# Patient Record
Sex: Female | Born: 1980 | Race: Black or African American | Hispanic: No | Marital: Single | Smoking: Never smoker
Health system: Southern US, Community
[De-identification: ages and names within clinical notes are randomized; demographics above are authoritative.]

## PROBLEM LIST (undated history)

## (undated) DIAGNOSIS — D649 Anemia, unspecified: Secondary | ICD-10-CM

## (undated) DIAGNOSIS — K429 Umbilical hernia without obstruction or gangrene: Secondary | ICD-10-CM

## (undated) DIAGNOSIS — R0602 Shortness of breath: Secondary | ICD-10-CM

## (undated) DIAGNOSIS — K219 Gastro-esophageal reflux disease without esophagitis: Secondary | ICD-10-CM

## (undated) HISTORY — PX: NO PAST SURGERIES: SHX2092

---
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
## 2001-02-08 ENCOUNTER — Emergency Department (HOSPITAL_COMMUNITY): Admission: EM | Admit: 2001-02-08 | Discharge: 2001-02-09 | Payer: Self-pay

## 2001-02-11 ENCOUNTER — Emergency Department (HOSPITAL_COMMUNITY): Admission: EM | Admit: 2001-02-11 | Discharge: 2001-02-11 | Payer: Self-pay | Admitting: Emergency Medicine

## 2001-07-14 ENCOUNTER — Encounter: Payer: Self-pay | Admitting: General Practice

## 2001-07-14 ENCOUNTER — Encounter: Admission: RE | Admit: 2001-07-14 | Discharge: 2001-07-14 | Payer: Self-pay | Admitting: General Practice

## 2003-06-03 ENCOUNTER — Emergency Department (HOSPITAL_COMMUNITY): Admission: EM | Admit: 2003-06-03 | Discharge: 2003-06-03 | Payer: Self-pay | Admitting: Emergency Medicine

## 2003-07-06 ENCOUNTER — Encounter: Admission: RE | Admit: 2003-07-06 | Discharge: 2003-07-06 | Payer: Self-pay | Admitting: Family Medicine

## 2003-08-03 DIAGNOSIS — L732 Hidradenitis suppurativa: Secondary | ICD-10-CM | POA: Insufficient documentation

## 2003-12-23 ENCOUNTER — Emergency Department (HOSPITAL_COMMUNITY): Admission: EM | Admit: 2003-12-23 | Discharge: 2003-12-23 | Payer: Self-pay | Admitting: Emergency Medicine

## 2005-09-27 ENCOUNTER — Emergency Department (HOSPITAL_COMMUNITY): Admission: EM | Admit: 2005-09-27 | Discharge: 2005-09-27 | Payer: Self-pay | Admitting: Family Medicine

## 2005-11-10 ENCOUNTER — Emergency Department (HOSPITAL_COMMUNITY): Admission: EM | Admit: 2005-11-10 | Discharge: 2005-11-10 | Payer: Self-pay | Admitting: Emergency Medicine

## 2006-03-11 ENCOUNTER — Emergency Department (HOSPITAL_COMMUNITY): Admission: EM | Admit: 2006-03-11 | Discharge: 2006-03-11 | Payer: Self-pay | Admitting: Emergency Medicine

## 2006-03-23 ENCOUNTER — Emergency Department (HOSPITAL_COMMUNITY): Admission: EM | Admit: 2006-03-23 | Discharge: 2006-03-24 | Payer: Self-pay | Admitting: Emergency Medicine

## 2006-04-04 ENCOUNTER — Emergency Department (HOSPITAL_COMMUNITY): Admission: EM | Admit: 2006-04-04 | Discharge: 2006-04-04 | Payer: Self-pay | Admitting: Emergency Medicine

## 2006-04-08 ENCOUNTER — Ambulatory Visit: Payer: Self-pay | Admitting: Internal Medicine

## 2006-04-23 ENCOUNTER — Ambulatory Visit: Payer: Self-pay | Admitting: *Deleted

## 2006-11-07 ENCOUNTER — Emergency Department (HOSPITAL_COMMUNITY): Admission: EM | Admit: 2006-11-07 | Discharge: 2006-11-07 | Payer: Self-pay | Admitting: Emergency Medicine

## 2006-11-25 ENCOUNTER — Ambulatory Visit: Payer: Self-pay | Admitting: Family Medicine

## 2006-12-17 DIAGNOSIS — K219 Gastro-esophageal reflux disease without esophagitis: Secondary | ICD-10-CM

## 2006-12-30 ENCOUNTER — Encounter (INDEPENDENT_AMBULATORY_CARE_PROVIDER_SITE_OTHER): Payer: Self-pay | Admitting: *Deleted

## 2007-01-20 ENCOUNTER — Telehealth (INDEPENDENT_AMBULATORY_CARE_PROVIDER_SITE_OTHER): Payer: Self-pay | Admitting: *Deleted

## 2007-02-02 ENCOUNTER — Encounter (INDEPENDENT_AMBULATORY_CARE_PROVIDER_SITE_OTHER): Payer: Self-pay | Admitting: *Deleted

## 2007-02-04 ENCOUNTER — Telehealth (INDEPENDENT_AMBULATORY_CARE_PROVIDER_SITE_OTHER): Payer: Self-pay | Admitting: *Deleted

## 2007-02-06 ENCOUNTER — Emergency Department (HOSPITAL_COMMUNITY): Admission: EM | Admit: 2007-02-06 | Discharge: 2007-02-06 | Payer: Self-pay | Admitting: Family Medicine

## 2007-02-16 ENCOUNTER — Emergency Department (HOSPITAL_COMMUNITY): Admission: EM | Admit: 2007-02-16 | Discharge: 2007-02-16 | Payer: Self-pay | Admitting: Emergency Medicine

## 2007-02-25 ENCOUNTER — Emergency Department (HOSPITAL_COMMUNITY): Admission: EM | Admit: 2007-02-25 | Discharge: 2007-02-25 | Payer: Self-pay | Admitting: Family Medicine

## 2007-04-06 ENCOUNTER — Encounter (INDEPENDENT_AMBULATORY_CARE_PROVIDER_SITE_OTHER): Payer: Self-pay | Admitting: *Deleted

## 2007-05-05 ENCOUNTER — Ambulatory Visit: Payer: Self-pay | Admitting: Nurse Practitioner

## 2007-05-05 DIAGNOSIS — R197 Diarrhea, unspecified: Secondary | ICD-10-CM

## 2007-05-05 DIAGNOSIS — K469 Unspecified abdominal hernia without obstruction or gangrene: Secondary | ICD-10-CM | POA: Insufficient documentation

## 2007-05-05 DIAGNOSIS — D649 Anemia, unspecified: Secondary | ICD-10-CM

## 2007-05-05 LAB — CONVERTED CEMR LAB
AST: 14 units/L (ref 0–37)
Basophils Relative: 1 % (ref 0–1)
Bilirubin Urine: NEGATIVE
Blood Glucose, Fingerstick: 103
CO2: 25 meq/L (ref 19–32)
Calcium: 9.3 mg/dL (ref 8.4–10.5)
Creatinine, Ser: 0.69 mg/dL (ref 0.40–1.20)
Glucose, Bld: 82 mg/dL (ref 70–99)
Ketones, urine, test strip: NEGATIVE
Lymphocytes Relative: 34 % (ref 12–46)
MCHC: 33.7 g/dL (ref 30.0–36.0)
MCV: 80.7 fL (ref 78.0–100.0)
Monocytes Relative: 12 % (ref 3–12)
Neutro Abs: 2 10*3/uL (ref 1.7–7.7)
Potassium: 4.5 meq/L (ref 3.5–5.3)
Protein, U semiquant: NEGATIVE
RBC: 4.71 M/uL (ref 3.87–5.11)
Specific Gravity, Urine: 1.025
TSH: 1.555 microintl units/mL (ref 0.350–5.50)
WBC: 4.3 10*3/uL (ref 4.0–10.5)

## 2007-05-06 ENCOUNTER — Encounter (INDEPENDENT_AMBULATORY_CARE_PROVIDER_SITE_OTHER): Payer: Self-pay | Admitting: Nurse Practitioner

## 2007-05-12 ENCOUNTER — Encounter (INDEPENDENT_AMBULATORY_CARE_PROVIDER_SITE_OTHER): Payer: Self-pay | Admitting: Family Medicine

## 2007-06-01 ENCOUNTER — Encounter (INDEPENDENT_AMBULATORY_CARE_PROVIDER_SITE_OTHER): Payer: Self-pay | Admitting: Family Medicine

## 2007-06-01 ENCOUNTER — Ambulatory Visit: Payer: Self-pay | Admitting: Family Medicine

## 2007-06-01 LAB — CONVERTED CEMR LAB
Glucose, Urine, Semiquant: NEGATIVE
Urobilinogen, UA: NEGATIVE

## 2007-06-11 ENCOUNTER — Emergency Department (HOSPITAL_COMMUNITY): Admission: EM | Admit: 2007-06-11 | Discharge: 2007-06-11 | Payer: Self-pay | Admitting: Family Medicine

## 2007-09-07 ENCOUNTER — Ambulatory Visit: Payer: Self-pay | Admitting: Family Medicine

## 2007-09-07 DIAGNOSIS — M940 Chondrocostal junction syndrome [Tietze]: Secondary | ICD-10-CM | POA: Insufficient documentation

## 2007-12-13 ENCOUNTER — Emergency Department (HOSPITAL_COMMUNITY): Admission: EM | Admit: 2007-12-13 | Discharge: 2007-12-13 | Payer: Self-pay | Admitting: Emergency Medicine

## 2008-02-08 ENCOUNTER — Emergency Department (HOSPITAL_COMMUNITY): Admission: EM | Admit: 2008-02-08 | Discharge: 2008-02-08 | Payer: Self-pay | Admitting: Emergency Medicine

## 2008-02-15 ENCOUNTER — Emergency Department (HOSPITAL_COMMUNITY): Admission: EM | Admit: 2008-02-15 | Discharge: 2008-02-15 | Payer: Self-pay | Admitting: Family Medicine

## 2008-08-08 ENCOUNTER — Emergency Department (HOSPITAL_COMMUNITY): Admission: EM | Admit: 2008-08-08 | Discharge: 2008-08-08 | Payer: Self-pay | Admitting: Emergency Medicine

## 2008-08-18 ENCOUNTER — Telehealth (INDEPENDENT_AMBULATORY_CARE_PROVIDER_SITE_OTHER): Payer: Self-pay | Admitting: Nurse Practitioner

## 2008-09-15 ENCOUNTER — Emergency Department (HOSPITAL_COMMUNITY): Admission: EM | Admit: 2008-09-15 | Discharge: 2008-09-15 | Payer: Self-pay | Admitting: *Deleted

## 2008-09-22 ENCOUNTER — Telehealth (INDEPENDENT_AMBULATORY_CARE_PROVIDER_SITE_OTHER): Payer: Self-pay | Admitting: *Deleted

## 2008-09-28 ENCOUNTER — Emergency Department (HOSPITAL_COMMUNITY): Admission: EM | Admit: 2008-09-28 | Discharge: 2008-09-28 | Payer: Self-pay | Admitting: Family Medicine

## 2008-10-06 ENCOUNTER — Ambulatory Visit: Payer: Self-pay | Admitting: Nurse Practitioner

## 2008-10-06 DIAGNOSIS — B977 Papillomavirus as the cause of diseases classified elsewhere: Secondary | ICD-10-CM | POA: Insufficient documentation

## 2008-10-06 DIAGNOSIS — N644 Mastodynia: Secondary | ICD-10-CM | POA: Insufficient documentation

## 2008-10-06 DIAGNOSIS — R5383 Other fatigue: Secondary | ICD-10-CM

## 2008-10-06 DIAGNOSIS — R5381 Other malaise: Secondary | ICD-10-CM

## 2008-10-13 LAB — CONVERTED CEMR LAB
ALT: <8 units/L (ref 0–35)
Alkaline Phosphatase: 55 units/L (ref 39–117)
BUN: 6 mg/dL (ref 6–23)
Basophils Absolute: 0 10*3/uL (ref 0.0–0.1)
CO2: 25 meq/L (ref 19–32)
Calcium: 9.8 mg/dL (ref 8.4–10.5)
Chloride: 108 meq/L (ref 96–112)
Creatinine, Ser: 0.77 mg/dL (ref 0.40–1.20)
HCT: 32.7 % — ABNORMAL LOW (ref 36.0–46.0)
Lymphs Abs: 0.9 10*3/uL (ref 0.7–4.0)
MCHC: 32.4 g/dL (ref 30.0–36.0)
MCV: 71.1 fL — ABNORMAL LOW (ref 78.0–100.0)
Monocytes Relative: 27 % — ABNORMAL HIGH (ref 3–12)
Neutro Abs: 1.1 10*3/uL — ABNORMAL LOW (ref 1.7–7.7)
Neutrophils Relative %: 37 % — ABNORMAL LOW (ref 43–77)
Platelets: 282 10*3/uL (ref 150–400)
RBC: 4.6 M/uL (ref 3.87–5.11)
TSH: 0.502 microintl units/mL (ref 0.350–4.500)

## 2009-02-19 ENCOUNTER — Ambulatory Visit: Payer: Self-pay | Admitting: Nurse Practitioner

## 2009-02-19 DIAGNOSIS — R109 Unspecified abdominal pain: Secondary | ICD-10-CM | POA: Insufficient documentation

## 2009-02-23 ENCOUNTER — Telehealth (INDEPENDENT_AMBULATORY_CARE_PROVIDER_SITE_OTHER): Payer: Self-pay | Admitting: Nurse Practitioner

## 2009-02-27 ENCOUNTER — Encounter (INDEPENDENT_AMBULATORY_CARE_PROVIDER_SITE_OTHER): Payer: Self-pay | Admitting: Nurse Practitioner

## 2009-03-06 ENCOUNTER — Telehealth (INDEPENDENT_AMBULATORY_CARE_PROVIDER_SITE_OTHER): Payer: Self-pay | Admitting: Nurse Practitioner

## 2009-03-13 ENCOUNTER — Ambulatory Visit (HOSPITAL_COMMUNITY): Admission: RE | Admit: 2009-03-13 | Discharge: 2009-03-13 | Payer: Self-pay | Admitting: Internal Medicine

## 2009-03-13 ENCOUNTER — Encounter (INDEPENDENT_AMBULATORY_CARE_PROVIDER_SITE_OTHER): Payer: Self-pay | Admitting: Nurse Practitioner

## 2009-03-15 ENCOUNTER — Telehealth (INDEPENDENT_AMBULATORY_CARE_PROVIDER_SITE_OTHER): Payer: Self-pay | Admitting: Nurse Practitioner

## 2009-03-27 ENCOUNTER — Telehealth (INDEPENDENT_AMBULATORY_CARE_PROVIDER_SITE_OTHER): Payer: Self-pay | Admitting: Nurse Practitioner

## 2009-05-08 ENCOUNTER — Telehealth (INDEPENDENT_AMBULATORY_CARE_PROVIDER_SITE_OTHER): Payer: Self-pay | Admitting: Nurse Practitioner

## 2009-05-15 ENCOUNTER — Encounter (INDEPENDENT_AMBULATORY_CARE_PROVIDER_SITE_OTHER): Payer: Self-pay | Admitting: *Deleted

## 2009-05-17 ENCOUNTER — Ambulatory Visit: Payer: Self-pay | Admitting: Obstetrics and Gynecology

## 2009-05-17 ENCOUNTER — Other Ambulatory Visit: Admission: RE | Admit: 2009-05-17 | Discharge: 2009-05-17 | Payer: Self-pay | Admitting: Obstetrics and Gynecology

## 2009-05-18 ENCOUNTER — Telehealth (INDEPENDENT_AMBULATORY_CARE_PROVIDER_SITE_OTHER): Payer: Self-pay | Admitting: Nurse Practitioner

## 2009-05-23 ENCOUNTER — Encounter (INDEPENDENT_AMBULATORY_CARE_PROVIDER_SITE_OTHER): Payer: Self-pay | Admitting: *Deleted

## 2009-06-07 ENCOUNTER — Ambulatory Visit: Payer: Self-pay | Admitting: Obstetrics and Gynecology

## 2009-07-23 ENCOUNTER — Encounter (INDEPENDENT_AMBULATORY_CARE_PROVIDER_SITE_OTHER): Payer: Self-pay | Admitting: Nurse Practitioner

## 2009-07-27 ENCOUNTER — Encounter (INDEPENDENT_AMBULATORY_CARE_PROVIDER_SITE_OTHER): Payer: Self-pay | Admitting: Nurse Practitioner

## 2009-08-01 ENCOUNTER — Telehealth (INDEPENDENT_AMBULATORY_CARE_PROVIDER_SITE_OTHER): Payer: Self-pay | Admitting: Nurse Practitioner

## 2009-09-19 ENCOUNTER — Telehealth (INDEPENDENT_AMBULATORY_CARE_PROVIDER_SITE_OTHER): Payer: Self-pay | Admitting: Nurse Practitioner

## 2009-11-06 ENCOUNTER — Ambulatory Visit: Payer: Self-pay | Admitting: Nurse Practitioner

## 2009-11-06 DIAGNOSIS — R51 Headache: Secondary | ICD-10-CM

## 2009-11-06 DIAGNOSIS — K089 Disorder of teeth and supporting structures, unspecified: Secondary | ICD-10-CM | POA: Insufficient documentation

## 2009-11-06 DIAGNOSIS — R519 Headache, unspecified: Secondary | ICD-10-CM | POA: Insufficient documentation

## 2009-11-07 LAB — CONVERTED CEMR LAB
Basophils Absolute: 0 10*3/uL (ref 0.0–0.1)
Basophils Relative: 1 % (ref 0–1)
Eosinophils Absolute: 0.3 10*3/uL (ref 0.0–0.7)
HCT: 36.8 % (ref 36.0–46.0)
Hemoglobin: 12.1 g/dL (ref 12.0–15.0)
Lymphocytes Relative: 38 % (ref 12–46)
Lymphs Abs: 1.4 10*3/uL (ref 0.7–4.0)
Neutro Abs: 1.6 10*3/uL — ABNORMAL LOW (ref 1.7–7.7)
Platelets: 313 10*3/uL (ref 150–400)
RDW: 14.2 % (ref 11.5–15.5)

## 2009-11-20 ENCOUNTER — Telehealth (INDEPENDENT_AMBULATORY_CARE_PROVIDER_SITE_OTHER): Payer: Self-pay | Admitting: Nurse Practitioner

## 2009-12-12 ENCOUNTER — Telehealth (INDEPENDENT_AMBULATORY_CARE_PROVIDER_SITE_OTHER): Payer: Self-pay | Admitting: Nurse Practitioner

## 2009-12-14 ENCOUNTER — Telehealth (INDEPENDENT_AMBULATORY_CARE_PROVIDER_SITE_OTHER): Payer: Self-pay | Admitting: Nurse Practitioner

## 2009-12-14 DIAGNOSIS — R079 Chest pain, unspecified: Secondary | ICD-10-CM

## 2009-12-15 ENCOUNTER — Emergency Department (HOSPITAL_COMMUNITY): Admission: EM | Admit: 2009-12-15 | Discharge: 2009-12-16 | Payer: Self-pay | Admitting: Emergency Medicine

## 2010-01-16 ENCOUNTER — Encounter (INDEPENDENT_AMBULATORY_CARE_PROVIDER_SITE_OTHER): Payer: Self-pay | Admitting: Nurse Practitioner

## 2010-02-28 ENCOUNTER — Ambulatory Visit: Payer: Self-pay | Admitting: Nurse Practitioner

## 2010-03-01 LAB — CONVERTED CEMR LAB
ALT: 8 units/L (ref 0–35)
Albumin: 4.5 g/dL (ref 3.5–5.2)
Alkaline Phosphatase: 49 units/L (ref 39–117)
Calcium: 9.1 mg/dL (ref 8.4–10.5)
Chloride: 105 meq/L (ref 96–112)
Eosinophils Relative: 7 % — ABNORMAL HIGH (ref 0–5)
Glucose, Bld: 82 mg/dL (ref 70–99)
HCT: 34.6 % — ABNORMAL LOW (ref 36.0–46.0)
HDL: 45 mg/dL (ref >39)
Hemoglobin: 11.2 g/dL — ABNORMAL LOW (ref 12.0–15.0)
LDL Cholesterol: 71 mg/dL (ref 0–99)
MCV: 78.6 fL (ref 78.0–100.0)
Monocytes Absolute: 0.4 10*3/uL (ref 0.1–1.0)
Monocytes Relative: 11 % (ref 3–12)
Neutrophils Relative %: 47 % (ref 43–77)
Platelets: 270 10*3/uL (ref 150–400)
RBC: 4.4 M/uL (ref 3.87–5.11)
RDW: 14.5 % (ref 11.5–15.5)
Total CHOL/HDL Ratio: 2.7
Total Protein: 7.3 g/dL (ref 6.0–8.3)
Triglycerides: 37 mg/dL (ref <150)
VLDL: 7 mg/dL (ref 0–40)
WBC: 3.7 10*3/uL — ABNORMAL LOW (ref 4.0–10.5)

## 2010-03-04 ENCOUNTER — Encounter (INDEPENDENT_AMBULATORY_CARE_PROVIDER_SITE_OTHER): Payer: Self-pay | Admitting: Nurse Practitioner

## 2010-03-04 ENCOUNTER — Ambulatory Visit (HOSPITAL_COMMUNITY): Admission: RE | Admit: 2010-03-04 | Discharge: 2010-03-04 | Payer: Self-pay | Admitting: Internal Medicine

## 2010-03-19 ENCOUNTER — Emergency Department (HOSPITAL_COMMUNITY)
Admission: EM | Admit: 2010-03-19 | Discharge: 2010-03-19 | Payer: Self-pay | Source: Home / Self Care | Admitting: Emergency Medicine

## 2010-05-05 ENCOUNTER — Encounter: Payer: Self-pay | Admitting: Internal Medicine

## 2010-05-14 NOTE — Progress Notes (Signed)
Summary: Derm update - No show  Phone Note From Other Clinic   Caller: Referral Coordinator Summary of Call: Just wanted to let you know that Pt noshowed for Derm appt on 09-18-09 Initial call taken by: Candi Leash,  September 19, 2009 9:33 AM  Follow-up for Phone Call        Good to know thanks Follow-up by: Lehman Prom FNP,  September 19, 2009 9:50 AM

## 2010-05-14 NOTE — Progress Notes (Signed)
Summary: CHEST PAIN   Phone Note Call from Patient   Summary of Call: pT IS UNDER A LOT OF CHEST PAIN AND LAST THURSDAY PT SPIT BLOOD AND SHE DOES NOT KNOW WHAT TO DO .  PT WANTS TO BE SEEN TODAY IF THAT IS POSSIBLE. MARTIN FNP Initial call taken by: Manon Hilding,  November 20, 2009 9:31 AM  Follow-up for Phone Call        c/o CP surrounding heart, worse yesterday, been going on for awhile.  States she thought that the blood was from her tooth.  States pain is non-radiating, denies SOB or diaphoresis. Will call back.   Follow-up by: Dutch Quint RN,  November 20, 2009 9:45 AM  Additional Follow-up for Phone Call Additional follow up Details #1::        Left message with female for pt. to return call.  Dutch Quint RN  November 20, 2009 12:10 PM  Left message with female for pt. to return call.  Dutch Quint RN  November 20, 2009 4:30 PM  Female answered phone "she not here" and hung up.  Dutch Quint RN  November 21, 2009 9:54 AM     Additional Follow-up for Phone Call Additional follow up Details #2::    spoke with pt she says that she has been having chest pain and spitting up blood on last week but not alot of blood.  she says that she has come in the office a couple of times for this and has an EKG done and nothing was found, but she is still having chest pain she says that sometimes she has soreness and it feels like a shock in her chest.  Levon Hedger  November 22, 2009 9:42 AM  Yes, pt has been seen several times for this and it was determined that pain is not cardiac related. It may be related to either muscle - from lifing or pulling objects in that case she can press chest and it should make pain worse or it also may be from GERD (acid reflux).  This may be the case if she is coughing up blood, which may be from stomach irritation.  Related to muscle pain - she can take tylenol Extra strength (would not advise ibuprofen at this time as this may make stomach problems worsen.  She can  take over the counter prilosec OTC twice daily  n.martin,fnp  November 22, 2009  9:53 AM    Additional Follow-up for Phone Call Additional follow up Details #3:: Details for Additional Follow-up Action Taken: pt aware of above information. Levon Hedger  November 22, 2009 10:28 AM

## 2010-05-14 NOTE — Letter (Signed)
Summary: [facility]  [facility]   Imported By: Arta Bruce 08/01/2009 11:48:15  _____________________________________________________________________  External Attachment:    Type:   Image     Comment:   External Document

## 2010-05-14 NOTE — Progress Notes (Signed)
Summary: Dermatology referral   Phone Note Outgoing Call   Summary of Call: Dermatology referral ordered under the advisement of GYN to whom she was previously referred.. 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   Initial call taken by: Lehman Prom FNP,  August 01, 2009 8:58 AM  Follow-up for Phone Call        referral printed at [facility]. Follow-up by: Mikey College CMA,  August 01, 2009 10:41 AM

## 2010-05-14 NOTE — Letter (Signed)
Summary: *HSN Results Follow up  [facility]  [address] [place], [place] [address]   Phone: [phone]  Fax: [phone]      05/23/2009   [facility] [address] [place], [place]  [address]   Dear  Ms. [facility],                            ____S.Drinkard,FNP   ____D. Gore,FNP       ____B. McPherson,MD   ____V. Rankins,MD    ____E. Mulberry,MD    ____N. Daphine Deutscher, FNP  ____D. Reche Dixon, MD    ____K. Philipp Deputy, MD    ____Other     This letter is to inform you that your recent test(s):  _______Pap Smear    _______Lab Test     _______X-ray    _______ is within acceptable limits  _______ requires a medication change  _______ requires a follow-up lab visit  _______ requires a follow-up visit with your provider   Comments:  We have been trying to reach you.  Please give the office a call at your earliest convenience.       _________________________________________________________ If you have any questions, please contact our office                     Sincerely,  Arm[place] Shannon [facility]

## 2010-05-14 NOTE — Progress Notes (Signed)
   Phone Note Call from Patient Call back at [facility] Phone [phone]   Summary of Call: The pt is having pain in her right breast and she is going to have an appointment in Feb at the [facility] and she is wondering if she can have a mammogram on the same day. The pt was confused with the date of the appointment because in the paper shows feb 2nd but in the computer shows feb 3rd [facility]  Initial call taken by: Manon Hilding,  May 08, 2009 4:22 PM  Follow-up for Phone Call        called [phone] person that answered phone said that pt was not there and as I was trying to leave a message for pt they hung up the phone on me.  Pt's appt is on 05/17/2009 @ 2:15 at [facility] Levon Hedger  May 14, 2009 10:36 AM   called [phone] person that answered phone said that pt was not there and as I was trying to leave a message for pt they hung up the phone on me.  Pt's appt is on 05/17/2009 @ 2:15 at [facility]. Will mail letter. Levon Hedger  May 15, 2009 4:35 PM  Follow-up by: Levon Hedger,  May 15, 2009 4:37 PM

## 2010-05-14 NOTE — Assessment & Plan Note (Signed)
Summary: F/u Chronic Issues   Vital Signs:  Patient profile:   30 year old female Menstrual status:  regular LMP:     11/03/2009 Weight:      144.7 pounds Temp:     98.1 degrees F oral Pulse rate:   72 / minute Pulse rhythm:   regular Resp:     16 per minute BP sitting:   108 / 73  (left arm) Cuff size:   regular  Vitals Entered By: Levon Hedger (November 06, 2009 11:39 AM) CC: follow-up visit up on private issues...has been having chest pain and in back that happens off and on, Headache Is Patient Diabetic? No Pain Assessment Patient in pain? no       Does patient need assistance? Functional Status Self care Ambulation Normal LMP (date): 11/03/2009 LMP - Character: heavy     Enter LMP: 11/03/2009 Last PAP Result done at [facility]   CC:  follow-up visit up on private issues...has been having chest pain and in back that happens off and on and Headache.  History of Present Illness:  Pt into the office for f/u on several different issues.  Vaginal issues - pt has been to GYN and had a biopsy done.  She was referred to Dermatology but pt was not able to keep the appt because her orange card expired  Vaginal warts - Pt used topical agent in the past which did help with warts.  however she has noticed that warts have returned at various other parts of her vaginal area.  Hx of anemia - Pt has not continued to take her iron pills.  She reports that the last time she was seen her and had labs she was advised that she did have to take the medications.  Headache - C/O of headache Pt does wear glasses but she only wears them at night for driving. Pt denies any anxiety No recent dental  exam   Headache HPI:      The location of the headaches are bilateral.  Headache quality is sharp (knife-like).        Positive alarm features include scalp tenderness.  The patient denies seizures.         Medications Prior to Update: 1)  Ferrous Sulfate 325 (65 Fe) Mg  Tabs  (Ferrous Sulfate) .Marland Kitchen.. 1 Tablet By Mouth Daily For Iron  Allergies (verified): No Known Drug Allergies  Review of Systems CV:  Complains of chest pain or discomfort; mostly after movement - she noticed last time when playin with her nephew. Then she has trouble breathing.Marland Kitchen Resp:  Denies cough and shortness of breath. GI:  Denies abdominal pain, nausea, and vomiting. Neuro:  Complains of headaches.  Physical Exam  General:  alert.   Head:  normocephalic.   Lungs:  normal breath sounds.   Heart:  normal rate and regular rhythm.   Abdomen:  normal bowel sounds.   Neurologic:  alert & oriented X3.   Skin:  color normal.   Psych:  Oriented X3.     Impression & Recommendations:  Problem # 1:  HEADACHE (ICD-784.0)  advised pt not to take excedrin   Her updated medication list for this problem includes:    Butalbital-apap 50-325 Mg Tabs (Butalbital-acetaminophen) ..... 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 1 tablet by mouth daily for iron  Orders: T-CBC w/Diff ([id number])  Problem # 5:  COSTOCHONDRITIS (ICD-733.6) pt admits that she does lots of lifting at her new job - reviewed with dx with pt  Complete Medication List: 1)  Ferrous Sulfate 325 (65 Fe) Mg Tabs (Ferrous sulfate) .Marland Kitchen.. 1 tablet by mouth daily for iron 2)  Podofilox 0.5 % Soln (Podofilox) .... Apply two times a day for 3 consecutive days of a week and repeat up to 4 weeks 3)  Butalbital-apap 50-325 Mg Tabs (Butalbital-acetaminophen) .... One tablet by mouth by mouth two times a day as needed for headache  Patient Instructions: 1)  Dermatology referra Arna Medici, reschedule  dermatology referral (see previous order in EMR) 2)  Chest pain - most likely from overuse of chest muscles 3)  Dentist - you will be put on the waiting list for the dental clinic.  They will notify you with the time/date of the appointment 4)  Headache - most likely tension or muscle 5)  No head CT needed 6)  DO NOT TAKE EXCEDRIN FOR HEADACHES AS THIS WILL CAUSE REBOUND HEADACHES 7)  Avoid soda, coffee, and tea 8)  Start sedapap as needed for headache 9)  if not effective will consider a medication to decrease stress or tension in your entire body.  It may be manifesting itself with your headaches 10)  Anemia - will recheck labs today Prescriptions: BUTALBITAL-APAP 50-325 MG TABS (BUTALBITAL-ACETAMINOPHEN) One tablet by mouth by mouth two times a day as needed for headache  #30 x 0   Entered and Authorized by:   Lehman Prom FNP   Signed by:   Lehman Prom FNP on 11/06/2009   Method used:   Print then Give to Patient   RxID:   [id number] PODOFILOX 0.5 % SOLN (PODOFILOX) Apply two times a day for 3 consecutive days of a week and repeat up to 4 weeks  #3.52ml x 0   Entered and Authorized by:   Lehman Prom FNP   Signed by:   Lehman Prom FNP on 11/06/2009   Method used:   Print then Give to Patient   RxID:   [id number]

## 2010-05-14 NOTE — Letter (Signed)
Summary: Handout Printed  Printed Handout:  - Depression-Brief 

## 2010-05-14 NOTE — Progress Notes (Signed)
Summary: Cardiology referral   Phone Note Outgoing Call   Summary of Call: refer pt to cardiology continous c/o chest pain  dx with costochondritis but pt is quite persistent about wanting further workup Initial call taken by: Lehman Prom FNP,  December 14, 2009 12:57 PM  Follow-up for Phone Call        PT HAVE AN APPT [facility] 01-16-10 @ 10:30AM DR Loraine Leriche SKAINS  LVM TO PT 2 RETURN MY CALL  Follow-up by: Cheryll Dessert,  December 24, 2009 9:06 AM  New Problems: CHEST PAIN (ICD-786.50)   New Problems: CHEST PAIN (ICD-786.50) Phone Note Outgoing Call   Summary of Call: refer pt to cardiology continous c/o chest pain  dx with costochondritis but pt is quite persistent about wanting further workup Initial call taken by: Lehman Prom FNP,  December 14, 2009 12:57 PM  Follow-up for Phone Call        PT HAVE AN APPT [facility] 01-16-10 @ 10:30AM DR Loraine Leriche SKAINS  LVM TO PT 2 RETURN MY CALL  Follow-up by: Cheryll Dessert,  December 24, 2009 9:06 AM  New Problems: CHEST PAIN (ICD-786.50)   New Problems: CHEST PAIN (ICD-786.50)

## 2010-05-14 NOTE — Letter (Signed)
Summary: DENTAL REFERRAL  DENTAL REFERRAL   Imported By: Arta Bruce 11/07/2009 10:52:22  _____________________________________________________________________  External Attachment:    Type:   Image     Comment:   External Document

## 2010-05-14 NOTE — Letter (Signed)
Summary: *HSN Results Follow up  [facility]  [address] [place], [place] [address]   Phone: [phone]  Fax: [phone]      05/15/2009   [facility] [address] [place], [place]  [address]   Dear  Ms. [facility],                            ____S.Drinkard,FNP   ____D. Gore,FNP       ____B. McPherson,MD   ____V. Rankins,MD    ____E. Mulberry,MD    ____N. Daphine Deutscher, FNP  ____D. Reche Dixon, MD    ____K. Philipp Deputy, MD    ____Other     This letter is to inform you that your recent test(s):  _______Pap Smear    _______Lab Test     _______X-ray    _______ is within acceptable limits  _______ requires a medication change  _______ requires a follow-up lab visit  _______ requires a follow-up visit with your provider   Comments:  We have tried reaching you at [phone].  Your appointment is scheduled for May 17, 2009 @ 2:15PM.  Please contact the office if you have any questions       _________________________________________________________ If you have any questions, please contact our office                     Sincerely,  Levon Hedger [facility]

## 2010-05-14 NOTE — Progress Notes (Signed)
Summary: Referral   Phone Note Call from Patient   Summary of Call: Pt requesting referral for her continued head aches and chest pains.  She also wants referral to GYN.  She does not want to keep coming here for the same issues. Pt can be reached at [phone] Initial call taken by: Vesta Mixer CMA,  December 12, 2009 10:11 AM  Follow-up for Phone Call        Levon Hedger  December 12, 2009 2:28 PM Left message on machine for pt to return call to the office.  Levon Hedger  December 14, 2009 10:21 AM spoke with pt she says that she is still having pain in her chest and wants to get checked out but would like to be referred to find out what is going on because she is still having sharp pain in her chest and SOB when she is not doing anything and she is very concerned.  she is also requesting an GYN referral she says that Ms. Daphine Deutscher knows why.  Additional Follow-up for Phone Call Additional follow up Details #1::        Pt has been to GYN - there is nothing else they can do.  She was referred by them to dermatology and pt missed the appt. Regarding her chest pain - this is due to overuse of muscle.  Can refer her to cardiology if it will make her feel more at eased but she should not be surprised if the workup is negative as well.  She need to be mindful of what is he lifting Additional Follow-up by: Lehman Prom FNP,  December 14, 2009 10:48 AM    Additional Follow-up for Phone Call Additional follow up Details #2::    pt informed and would like to be referred to Cardiology. Levon Hedger  December 14, 2009 12:53 PM

## 2010-05-14 NOTE — Progress Notes (Signed)
Summary: Breast tenderness   Phone Note Call from Patient   Summary of Call: pt called and would like a mammogram appt. in the evening...Marland KitchenMarland Kitchen pt says she is having continuing pain both breast and she would lke to know what is going on Initial call taken by: [place] Shannon,  May 18, 2009 4:45 PM  Follow-up for Phone Call        Pain in breast does not necessarily warrant a mammogram. Mammogram in 30 year old will be very indeterminate due to dense breast tissue.  Does she feel any lumps that are out of the ordinary?  round areas that move under your fingers are usually breast ducts and are normal she can come discuss this further but problem is likely hormonal since it involves both breasts.  She should avoid soda, tea, coffee, cigarettes and chocolate as these increase the tenderness. Ibuprofen as needed for pain worse about 3-5 days before cycle Follow-up by: Lehman Prom FNP,  May 18, 2009 5:20 PM  Additional Follow-up for Phone Call Additional follow up Details #1::        Left message with lady for pt to call back.Marland KitchenMarland KitchenMarland KitchenArmenia Shannon  May 21, 2009 12:59 PM  Left message with lady for pt to call back.Marland KitchenMarland KitchenArmenia Shannon  May 22, 2009 11:35 AM  Left message with lady for pt to call back..wil mail letter.Marland KitchenMarland KitchenArmenia Shannon  May 22, 2009 4:41 PM   Left message with lady for pt to call back.Marland KitchenMarland KitchenArmenia Shannon  May 23, 2009 11:17 AM

## 2010-05-14 NOTE — Assessment & Plan Note (Signed)
Summary: Headache/Chest pain   Vital Signs:  Patient profile:   30 year old female Menstrual status:  regular Height:      64.5 inches Weight:      147.8 pounds BMI:     25.07 Temp:     97.9 degrees F oral Pulse rate:   76 / minute Pulse rhythm:   regular Resp:     18 per minute BP sitting:   90 / 52  (left arm) Cuff size:   regular  Vitals Entered By: [place] Shannon (February 28, 2010 1:02 PM)  Nutrition Counseling: Patient's BMI is greater than 25 and therefore counseled on weight management options. CC: pt says she has infection in her tooth... pt wants a MRI because of her headaches.... pt says she is getting numbness in fingers and right knee...  Does patient need assistance? Functional Status Self care Ambulation Normal   CC:  pt says she has infection in her tooth... pt wants a MRI because of her headaches.... pt says she is getting numbness in fingers and right knee....  History of Present Illness:  Pt into the office for f/u on dental pain. Pt was called from the dental clinic and was told that she has an appt in 2 weeks. Pain mostly in left back tooth - ? wisdom tooth.  She has never had these extracted. At time has bleeding in the same areaa  Headaches - intermittent family hx of migraines  She was not able to afford the sedapap as ordered on last visit headache is usually on the left side  Chest pain - pt has been to the cardiologist and was advised that chest pain is not cardiac related. Still has intermittent chest pain  Current Medications (verified): 1)  Ferrous Sulfate 325 (65 Fe) Mg  Tabs (Ferrous Sulfate) .Marland Kitchen.. 1 Tablet By Mouth Daily For Iron 2)  Podofilox 0.5 % Soln (Podofilox) .... Apply Two Times A Day For 3 Consecutive Days of A Week and Repeat Up To 4 Weeks 3)  Butalbital-Apap 50-325 Mg Tabs (Butalbital-Acetaminophen) .... One Tablet By Mouth By Mouth Two Times A Day As Needed For Headache  Allergies (verified): No Known Drug  Allergies  Review of Systems General:  dental pain. CV:  Complains of chest pain or discomfort. Resp:  Denies cough. GI:  Denies nausea and vomiting. Neuro:  Complains of headaches.  Physical Exam  General:  alert.   Head:  normocephalic.   Mouth:  fair dentition.  impacted wisdom teeth Chest Wall:  no tenderness with palpation Lungs:  normal breath sounds.   Heart:  normal rate and regular rhythm.   Msk:  normal ROM.   Neurologic:  alert & oriented X3.   Skin:  color normal.   Psych:  Oriented X3.     Impression & Recommendations:  Problem # 1:  CHEST PAIN (ICD-786.50) cardiac workup is negative advised pt that symptoms are likely anxiety/panic handout given  Problem # 2:  DENTAL PAIN (ICD-525.9) will refer to dental clinic will cover with amoxil  Problem # 3:  HEADACHE (ICD-784.0) will order head ct as per pt request advised her to limit caffiene, nicotine and stress The following medications were removed from the medication list:    Butalbital-apap 50-325 Mg Tabs (Butalbital-acetaminophen) ..... One tablet by mouth by mouth two times a day as needed for headache  Orders: T-Comprehensive Metabolic Panel ([address]109-60454) T-CBC w/Diff ([id number]) T-Lipid Profile (29562-13086) Rapid HIV  (57846) CT without Contrast (CT w/o contrast)  Complete Medication  List: 1)  Ferrous Sulfate 325 (65 Fe) Mg Tabs (Ferrous sulfate) .Marland Kitchen.. 1 tablet by mouth daily for iron 2)  Amoxicillin 500 Mg Tabs (Amoxicillin) .... One tablet by mouth three times a day for infection  Patient Instructions: 1)  Headache - you will be called with the time/date of the head CT appointment 2)  You have declined the flu vaccine today. 3)  Stress - this may be causing some of your symptoms.  Read the handout and see how many of the symptoms apply to you.  If more than not then you may be having problems with mood/anxiety that you are not aware of 4)  Dental problems - you should take the medications for  infections 5)  and wait for the dental appointment Prescriptions: AMOXICILLIN 500 MG TABS (AMOXICILLIN) One tablet by mouth three times a day for infection  #30 x 0   Entered and Authorized by:   Lehman Prom FNP   Signed by:   Lehman Prom FNP on 02/28/2010   Method used:   Print then Give to Patient   RxID:   [id number]    Orders Added: 1)  Est. Patient Level III [[address]782] 2)  T-Comprehensive Metabolic Panel [80053-22900] 3)  T-CBC w/Diff [95621-30865] 4)  T-Lipid Profile [80061-22930] 5)  Rapid HIV  [92370] 6)  CT without Contrast [CT w/o contrast]   Not Administered:    Influenza Vaccine not given due to: declined     Laboratory Results  Date/Time Received: February 28, 2010 2:40 PM  Date/Time Reported: February 28, 2010 2:40 PM   Other Tests  Rapid HIV: negative

## 2010-05-14 NOTE — Letter (Signed)
Summary: Handout Printed  Printed Handout:  - Headache, Tension (Muscle Contraction Headache) 

## 2010-05-14 NOTE — Consult Note (Signed)
Summary: Consultation Report.Marland Kitchen//[facility]  Consultation Report.Marland Kitchen//[facility]   Imported By: Arta Bruce 01/21/2010 11:48:14  _____________________________________________________________________  External Attachment:    Type:   Image     Comment:   External Document

## 2010-05-14 NOTE — Miscellaneous (Signed)
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

## 2010-06-10 ENCOUNTER — Inpatient Hospital Stay (INDEPENDENT_AMBULATORY_CARE_PROVIDER_SITE_OTHER)
Admission: RE | Admit: 2010-06-10 | Discharge: 2010-06-10 | Disposition: A | Discharge status: Home / Self Care | Payer: Self-pay | Source: Ambulatory Visit | Attending: Family Medicine | Admitting: Family Medicine

## 2010-06-10 ENCOUNTER — Ambulatory Visit (INDEPENDENT_AMBULATORY_CARE_PROVIDER_SITE_OTHER): Payer: Self-pay

## 2010-06-10 DIAGNOSIS — J45909 Unspecified asthma, uncomplicated: Secondary | ICD-10-CM

## 2010-06-10 DIAGNOSIS — K5900 Constipation, unspecified: Secondary | ICD-10-CM

## 2010-06-10 LAB — POCT URINALYSIS DIPSTICK
Ketones, ur: NEGATIVE mg/dL
Nitrite: NEGATIVE
Protein, ur: 100 mg/dL — AB

## 2010-06-10 LAB — POCT PREGNANCY, URINE: Preg Test, Ur: NEGATIVE

## 2010-07-22 LAB — URINALYSIS, ROUTINE W REFLEX MICROSCOPIC
Glucose, UA: NEGATIVE mg/dL
Hgb urine dipstick: NEGATIVE
Ketones, ur: NEGATIVE mg/dL
Protein, ur: NEGATIVE mg/dL
Specific Gravity, Urine: 1.016 (ref 1.005–1.030)

## 2010-07-22 LAB — POCT PREGNANCY, URINE: Preg Test, Ur: NEGATIVE

## 2010-09-18 ENCOUNTER — Inpatient Hospital Stay (INDEPENDENT_AMBULATORY_CARE_PROVIDER_SITE_OTHER)
Admission: RE | Admit: 2010-09-18 | Discharge: 2010-09-18 | Disposition: A | Discharge status: Home / Self Care | Payer: Self-pay | Source: Ambulatory Visit

## 2010-09-18 DIAGNOSIS — N390 Urinary tract infection, site not specified: Secondary | ICD-10-CM

## 2010-09-18 DIAGNOSIS — A059 Bacterial foodborne intoxication, unspecified: Secondary | ICD-10-CM

## 2010-09-18 LAB — POCT URINALYSIS DIP (DEVICE)
Protein, ur: 30 mg/dL — AB
Urobilinogen, UA: 0.2 mg/dL (ref 0.0–1.0)

## 2010-09-18 LAB — POCT I-STAT, CHEM 8
BUN: 7 mg/dL (ref 6–23)
Creatinine, Ser: 0.8 mg/dL (ref 0.4–1.2)
Hemoglobin: 13.3 g/dL (ref 12.0–15.0)
Potassium: 3.6 mEq/L (ref 3.5–5.1)
Sodium: 139 mEq/L (ref 135–145)
TCO2: 24 mmol/L (ref 0–100)

## 2010-09-18 LAB — POCT PREGNANCY, URINE: Preg Test, Ur: NEGATIVE

## 2010-09-19 LAB — URINE CULTURE

## 2010-11-11 ENCOUNTER — Inpatient Hospital Stay (INDEPENDENT_AMBULATORY_CARE_PROVIDER_SITE_OTHER)
Admission: RE | Admit: 2010-11-11 | Discharge: 2010-11-11 | Disposition: A | Discharge status: Home / Self Care | Payer: Self-pay | Source: Ambulatory Visit | Attending: Emergency Medicine | Admitting: Emergency Medicine

## 2010-11-11 DIAGNOSIS — R319 Hematuria, unspecified: Secondary | ICD-10-CM

## 2010-11-11 DIAGNOSIS — R198 Other specified symptoms and signs involving the digestive system and abdomen: Secondary | ICD-10-CM

## 2010-11-11 DIAGNOSIS — N390 Urinary tract infection, site not specified: Secondary | ICD-10-CM

## 2010-11-11 DIAGNOSIS — D649 Anemia, unspecified: Secondary | ICD-10-CM

## 2010-11-11 LAB — POCT URINALYSIS DIP (DEVICE)
Bilirubin Urine: NEGATIVE
Glucose, UA: NEGATIVE mg/dL
Ketones, ur: NEGATIVE mg/dL
Leukocytes, UA: NEGATIVE
Nitrite: NEGATIVE

## 2010-11-11 LAB — POCT PREGNANCY, URINE: Preg Test, Ur: NEGATIVE

## 2010-11-11 LAB — WET PREP, GENITAL
Trich, Wet Prep: NONE SEEN
Yeast Wet Prep HPF POC: NONE SEEN

## 2010-11-11 LAB — CBC
HCT: 31.5 % — ABNORMAL LOW (ref 36.0–46.0)
MCH: 24.9 pg — ABNORMAL LOW (ref 26.0–34.0)
MCV: 74.1 fL — ABNORMAL LOW (ref 78.0–100.0)
Platelets: 299 10*3/uL (ref 150–400)
RDW: 14.2 % (ref 11.5–15.5)
WBC: 5.5 10*3/uL (ref 4.0–10.5)

## 2010-11-11 LAB — POCT H PYLORI SCREEN: H. PYLORI SCREEN, POC: NEGATIVE

## 2010-11-11 LAB — DIFFERENTIAL
Basophils Relative: 1 % (ref 0–1)
Eosinophils Absolute: 0.1 10*3/uL (ref 0.0–0.7)
Eosinophils Relative: 2 % (ref 0–5)
Monocytes Absolute: 0.7 10*3/uL (ref 0.1–1.0)
Neutro Abs: 3.2 10*3/uL (ref 1.7–7.7)

## 2010-11-12 LAB — URINE CULTURE: Colony Count: 60000

## 2011-01-03 LAB — POCT URINALYSIS DIP (DEVICE)
Bilirubin Urine: NEGATIVE
Nitrite: NEGATIVE
Urobilinogen, UA: 0.2
pH: 5.5

## 2011-01-05 ENCOUNTER — Inpatient Hospital Stay (INDEPENDENT_AMBULATORY_CARE_PROVIDER_SITE_OTHER)
Admission: RE | Admit: 2011-01-05 | Discharge: 2011-01-05 | Disposition: A | Discharge status: Home / Self Care | Payer: Self-pay | Source: Ambulatory Visit | Attending: Family Medicine | Admitting: Family Medicine

## 2011-01-05 DIAGNOSIS — R071 Chest pain on breathing: Secondary | ICD-10-CM

## 2011-01-14 LAB — POCT URINALYSIS DIP (DEVICE)
Bilirubin Urine: NEGATIVE
Hgb urine dipstick: NEGATIVE
Ketones, ur: NEGATIVE mg/dL
Protein, ur: 30 mg/dL — AB
Specific Gravity, Urine: 1.025 (ref 1.005–1.030)
pH: 5 (ref 5.0–8.0)

## 2011-01-14 LAB — POCT I-STAT, CHEM 8
BUN: 5 — ABNORMAL LOW
Calcium, Ion: 1.24
Chloride: 104
Creatinine, Ser: 0.8
Glucose, Bld: 82
TCO2: 27

## 2011-01-16 ENCOUNTER — Inpatient Hospital Stay (HOSPITAL_COMMUNITY)
Admission: AD | Admit: 2011-01-16 | Discharge: 2011-01-16 | Disposition: A | Discharge status: Home / Self Care | Payer: Self-pay | Source: Ambulatory Visit | Attending: Obstetrics & Gynecology | Admitting: Obstetrics & Gynecology

## 2011-01-16 ENCOUNTER — Encounter (HOSPITAL_COMMUNITY): Payer: Self-pay

## 2011-01-16 DIAGNOSIS — J40 Bronchitis, not specified as acute or chronic: Secondary | ICD-10-CM

## 2011-01-16 DIAGNOSIS — J45909 Unspecified asthma, uncomplicated: Secondary | ICD-10-CM | POA: Insufficient documentation

## 2011-01-16 HISTORY — DX: Shortness of breath: R06.02

## 2011-01-16 MED ORDER — AZITHROMYCIN 250 MG PO TABS: ORAL_TABLET | ORAL | Status: AC

## 2011-01-16 MED ORDER — GUAIFENESIN-CODEINE 100-10 MG/5ML PO SYRP: 5.0000 mL | ORAL_SOLUTION | Freq: Three times a day (TID) | ORAL | Status: AC | PRN

## 2011-01-16 MED ORDER — ALBUTEROL SULFATE (5 MG/ML) 0.5% IN NEBU
2.5000 mg | INHALATION_SOLUTION | Freq: Once | RESPIRATORY_TRACT | Status: AC
  Administered 2011-01-16: 2.5 mg via RESPIRATORY_TRACT
  Filled 2011-01-16: qty 0.5

## 2011-01-16 NOTE — ED Provider Notes (Signed)
Attestation of Attending Supervision of Advanced Practitioner: Evaluation and management procedures were performed by the PA/NP/CNM/OB Fellow under my supervision/collaboration. Chart reviewed and agree with management and plan.  Marilyn Rodgers A 01/16/2011 10:58 PM

## 2011-01-16 NOTE — ED Provider Notes (Signed)
History     CSN: [id number] Arrival date & time: 01/16/2011  4:26 PM  Chief Complaint  Patient presents with  . Breast Pain   HPI Marilyn Rodgers is a 30 y.o. female who presents to [facility] for bilateral breast tenderness and feeling soreness in chest. States she has asthma and has had to use her inhaler x 2 today. Has had cough and wheezing and sometimes feels like she can't get a deep breath.  Productive cough for the past several days with low grade fever.  Past Medical History  Diagnosis Date  . Shortness of breath     No past surgical history on file.  Family History  Problem Relation Age of Onset  . Diabetes Maternal Aunt   . Heart disease Maternal Aunt   . Hypertension Maternal Aunt   . Cancer Paternal Aunt   . Arthritis Paternal Grandmother   . Diabetes Paternal Grandmother     History  Substance Use Topics  . Smoking status: Never Smoker   . Smokeless tobacco: Never Used  . Alcohol Use:     OB History    Grav Para Term Preterm Abortions TAB SAB Ect Mult Living   0               Review of Systems  Constitutional: Positive for fever, chills and fatigue.  HENT: Positive for congestion and sneezing.   Respiratory: Positive for cough, chest tightness and wheezing.   Cardiovascular: Negative for palpitations and leg swelling.  Gastrointestinal: Negative for nausea, vomiting, abdominal pain, diarrhea and constipation.  Genitourinary: Negative for dysuria, frequency, decreased urine volume and pelvic pain.  Musculoskeletal: Positive for back pain.  Skin: Negative.   Neurological: Positive for headaches. Negative for dizziness.  Psychiatric/Behavioral: Negative for behavioral problems, confusion and agitation. The patient is not nervous/anxious.     Allergies  Other  Home Medications  No current outpatient prescriptions on file.  BP 111/69  Pulse 103  Temp(Src) 99.1 F (37.3 C) (Oral)  Resp 16  Ht 5' 5.5" (1.664 m)  Wt 144 lb 12.8 oz (65.681 kg)  BMI  23.73 kg/m2  SpO2 97%  Results for orders placed during the hospital encounter of 01/16/11 (from the past 24 hour(s))  POCT PREGNANCY, URINE     Status: Normal   Collection Time   01/16/11  6:57 PM      Component Value Range   Preg Test, Ur NEGATIVE      Physical Exam  Nursing note and vitals reviewed. Constitutional: She is oriented to person, place, and time. She appears well-developed and well-nourished. No distress.  HENT:  Head: Normocephalic.  Eyes: EOM are normal.  Neck: Neck supple.  Cardiovascular: Normal rate and regular rhythm.   No murmur heard. Pulmonary/Chest: Accessory muscle usage present. No respiratory distress.       Decreased breath sounds bilaterally with occasional wheezing heard. Breast tenderness bilaterally with radiation to back. Increased pain with deep breath cough and palpation.  Abdominal: Soft. There is no tenderness.  Musculoskeletal: Normal range of motion.  Neurological: She is alert and oriented to person, place, and time. No cranial nerve deficit.  Skin: Skin is warm and dry.   Re-assessment: After Albuterol/Atrovent treatment the patient states she is feeling a lot better and breathing easier. On exam lung sounds have improved and patient appears comfortable.   Assessment:  Asthmatic Bronchitis  Plan:  Z-Pak   Robitussin AC   Continue albuterol inhailer   Follow up with GYN for breast  tenderness   ED Course  Procedures  MDM          Kerrie Buffalo, NP 01/16/11 2003

## 2011-01-16 NOTE — Progress Notes (Signed)
Pt states she has been having pain on the underside of both breasts for about 3 days, worse on the left side.

## 2011-01-22 LAB — DIFFERENTIAL
Basophils Relative: 1
Eosinophils Relative: 5
Monocytes Absolute: 0.6
Monocytes Relative: 14 — ABNORMAL HIGH
Neutro Abs: 2.3

## 2011-01-22 LAB — I-STAT 8, (EC8 V) (CONVERTED LAB)
BUN: 9
Bicarbonate: 27.4 — ABNORMAL HIGH
Glucose, Bld: 86
Hemoglobin: 12.9
Sodium: 139
pH, Ven: 7.33 — ABNORMAL HIGH

## 2011-01-22 LAB — CBC
HCT: 35.1 — ABNORMAL LOW
Hemoglobin: 11.5 — ABNORMAL LOW
MCHC: 32.7
MCV: 79.4
RBC: 4.42

## 2011-01-22 LAB — POCT I-STAT CREATININE: Creatinine, Ser: 0.8

## 2011-01-30 ENCOUNTER — Encounter (HOSPITAL_COMMUNITY): Payer: Self-pay | Admitting: *Deleted

## 2011-01-30 ENCOUNTER — Inpatient Hospital Stay (HOSPITAL_COMMUNITY)
Admission: AD | Admit: 2011-01-30 | Discharge: 2011-01-30 | Disposition: A | Discharge status: Home / Self Care | Payer: Self-pay | Source: Ambulatory Visit | Attending: Family Medicine | Admitting: Family Medicine

## 2011-01-30 DIAGNOSIS — K219 Gastro-esophageal reflux disease without esophagitis: Secondary | ICD-10-CM | POA: Insufficient documentation

## 2011-01-30 DIAGNOSIS — R109 Unspecified abdominal pain: Secondary | ICD-10-CM | POA: Insufficient documentation

## 2011-01-30 HISTORY — DX: Umbilical hernia without obstruction or gangrene: K42.9

## 2011-01-30 HISTORY — DX: Gastro-esophageal reflux disease without esophagitis: K21.9

## 2011-01-30 LAB — POCT PREGNANCY, URINE: Preg Test, Ur: NEGATIVE

## 2011-01-30 LAB — URINALYSIS, ROUTINE W REFLEX MICROSCOPIC
Bilirubin Urine: NEGATIVE
Glucose, UA: NEGATIVE mg/dL
Specific Gravity, Urine: >1.03 — ABNORMAL HIGH (ref 1.005–1.030)

## 2011-01-30 LAB — CBC
MCHC: 34.2 g/dL (ref 30.0–36.0)
Platelets: 279 10*3/uL (ref 150–400)
RDW: 14.9 % (ref 11.5–15.5)
WBC: 3.9 10*3/uL — ABNORMAL LOW (ref 4.0–10.5)

## 2011-01-30 LAB — COMPREHENSIVE METABOLIC PANEL
ALT: 5 U/L (ref 0–35)
AST: 15 U/L (ref 0–37)
Albumin: 4.2 g/dL (ref 3.5–5.2)
Alkaline Phosphatase: 67 U/L (ref 39–117)
BUN: 13 mg/dL (ref 6–23)
Chloride: 100 mEq/L (ref 96–112)
Potassium: 3.9 mEq/L (ref 3.5–5.1)
Sodium: 135 mEq/L (ref 135–145)
Total Bilirubin: 0.3 mg/dL (ref 0.3–1.2)
Total Protein: 7.9 g/dL (ref 6.0–8.3)

## 2011-01-30 LAB — URINE MICROSCOPIC-ADD ON

## 2011-01-30 MED ORDER — SUCRALFATE 1 GM/10ML PO SUSP: 1.0000 g | Freq: Four times a day (QID) | ORAL | Status: AC

## 2011-01-30 MED ORDER — GI COCKTAIL ~~LOC~~
30.0000 mL | Freq: Once | ORAL | Status: AC
  Administered 2011-01-30: 30 mL via ORAL
  Filled 2011-01-30: qty 30

## 2011-01-30 NOTE — Progress Notes (Signed)
Pt in c/o a dull, aching type pain "underneath left ribs and left breast".  Believes the pain is coming from her GERD.  States pain has been going on "for a while", worse x2 weeks.  Denies any bleeding or abnormal discharge.

## 2011-01-30 NOTE — Progress Notes (Signed)
Was here a couple wks ago- had same pain then- pain under rt breast/ribs.  Was given a breathing treatment.  Thinks it is the GERD, med for that is not working.  "has been researching it and it is either her spleen or intestines".

## 2011-01-30 NOTE — ED Provider Notes (Signed)
History     No chief complaint on file.  The history is provided by the patient.   Pt is not pregnant and complains of upper abdominal pain?GERD.  Pt has history of GERD and says this is the same kind of pain except this has not gone away with her Prilosec   Past Medical History  Diagnosis Date  . Shortness of breath   . GERD (gastroesophageal reflux disease)   . Umbilical hernia     Past Surgical History  Procedure Date  . No past surgeries     Family History  Problem Relation Age of Onset  . Diabetes Maternal Aunt   . Heart disease Maternal Aunt   . Hypertension Maternal Aunt   . Cancer Paternal Aunt   . Arthritis Paternal Grandmother   . Diabetes Paternal Grandmother     History  Substance Use Topics  . Smoking status: Never Smoker   . Smokeless tobacco: Never Used  . Alcohol Use: No    Allergies:  Allergies  Allergen Reactions  . Other Swelling    Eggplant causes severe swelling-particularly with throat, mouth, and hands    Prescriptions prior to admission  Medication Sig Dispense Refill  . ALBUTEROL SULFATE HFA IN Inhale 2 puffs into the lungs every 4 (four) hours as needed. For shortness of breath       . ferrous gluconate (FERGON) 325 MG tablet Take 325 mg by mouth 3 (three) times a week.        . Iron Combinations (IRON COMPLEX PO) Take 1 tablet by mouth 3 (three) times a week.        Marland Kitchen omeprazole (PRILOSEC) 20 MG capsule Take 20 mg by mouth 2 (two) times daily.          Review of Systems  Constitutional: Negative for fever.  Gastrointestinal: Positive for heartburn and abdominal pain.  Genitourinary: Negative for dysuria and urgency.   Physical Exam   Blood pressure 116/70, pulse 89, temperature 98.8 F (37.1 C), temperature source Oral, resp. rate 18, height 5\' 6"  (1.676 m), weight 142 lb (64.411 kg), last menstrual period 01/20/2011, SpO2 99.00%.  Physical Exam  Constitutional: She is oriented to person, place, and time. She appears  well-developed and well-nourished.  Eyes: Pupils are equal, round, and reactive to light.  Neck: Normal range of motion.  Cardiovascular: Normal rate.   Respiratory: Effort normal.  GI: Soft. Bowel sounds are normal.  Musculoskeletal: Normal range of motion.  Neurological: She is alert and oriented to person, place, and time.  Skin: Skin is warm and dry.  Psychiatric: She has a normal mood and affect.    [facility] Course  Procedures  Pt given GI cocktail and sx resolved  Assessment and Plan  GERD- prescription given for Carafate  LINEBERRY,SUSAN 01/30/2011, 2:15 PM

## 2011-02-05 ENCOUNTER — Inpatient Hospital Stay (INDEPENDENT_AMBULATORY_CARE_PROVIDER_SITE_OTHER)
Admission: RE | Admit: 2011-02-05 | Discharge: 2011-02-05 | Disposition: A | Discharge status: Home / Self Care | Payer: Self-pay | Source: Ambulatory Visit | Attending: Family Medicine | Admitting: Family Medicine

## 2011-02-05 DIAGNOSIS — R079 Chest pain, unspecified: Secondary | ICD-10-CM

## 2011-02-05 DIAGNOSIS — R1013 Epigastric pain: Secondary | ICD-10-CM

## 2011-02-10 NOTE — ED Provider Notes (Signed)
Chart reviewed and agree with management and plan.  

## 2011-02-12 ENCOUNTER — Inpatient Hospital Stay (INDEPENDENT_AMBULATORY_CARE_PROVIDER_SITE_OTHER)
Admission: RE | Admit: 2011-02-12 | Discharge: 2011-02-12 | Disposition: A | Discharge status: Home / Self Care | Payer: Self-pay | Source: Ambulatory Visit | Attending: Emergency Medicine | Admitting: Emergency Medicine

## 2011-02-12 DIAGNOSIS — R109 Unspecified abdominal pain: Secondary | ICD-10-CM

## 2011-02-12 LAB — POCT URINALYSIS DIP (DEVICE)
Glucose, UA: NEGATIVE mg/dL
Hgb urine dipstick: NEGATIVE
Specific Gravity, Urine: 1.025 (ref 1.005–1.030)
Urobilinogen, UA: 0.2 mg/dL (ref 0.0–1.0)
pH: 5.5 (ref 5.0–8.0)

## 2011-02-12 LAB — POCT I-STAT, CHEM 8
Calcium, Ion: 1.25 mmol/L (ref 1.12–1.32)
Creatinine, Ser: 0.7 mg/dL (ref 0.50–1.10)
Glucose, Bld: 92 mg/dL (ref 70–99)
Hemoglobin: 13.9 g/dL (ref 12.0–15.0)
Potassium: 3.8 mEq/L (ref 3.5–5.1)

## 2011-04-13 ENCOUNTER — Other Ambulatory Visit: Payer: Self-pay

## 2011-04-13 ENCOUNTER — Encounter (HOSPITAL_COMMUNITY): Payer: Self-pay | Admitting: *Deleted

## 2011-04-13 ENCOUNTER — Emergency Department (HOSPITAL_COMMUNITY)
Admission: EM | Admit: 2011-04-13 | Discharge: 2011-04-13 | Disposition: A | Discharge status: Home / Self Care | Payer: Self-pay | Attending: Emergency Medicine | Admitting: Emergency Medicine

## 2011-04-13 DIAGNOSIS — R0602 Shortness of breath: Secondary | ICD-10-CM | POA: Insufficient documentation

## 2011-04-13 DIAGNOSIS — R05 Cough: Secondary | ICD-10-CM | POA: Insufficient documentation

## 2011-04-13 DIAGNOSIS — K219 Gastro-esophageal reflux disease without esophagitis: Secondary | ICD-10-CM | POA: Insufficient documentation

## 2011-04-13 DIAGNOSIS — R059 Cough, unspecified: Secondary | ICD-10-CM | POA: Insufficient documentation

## 2011-04-13 DIAGNOSIS — J45909 Unspecified asthma, uncomplicated: Secondary | ICD-10-CM | POA: Insufficient documentation

## 2011-04-13 MED ORDER — PREDNISONE 10 MG PO TABS: ORAL_TABLET | ORAL | Status: DC

## 2011-04-13 MED ORDER — IPRATROPIUM BROMIDE 0.02 % IN SOLN
0.5000 mg | Freq: Once | RESPIRATORY_TRACT | Status: AC
  Administered 2011-04-13: 0.5 mg via RESPIRATORY_TRACT
  Filled 2011-04-13: qty 2.5

## 2011-04-13 MED ORDER — PREDNISONE 20 MG PO TABS
60.0000 mg | ORAL_TABLET | Freq: Once | ORAL | Status: AC
  Administered 2011-04-13: 60 mg via ORAL
  Filled 2011-04-13: qty 3

## 2011-04-13 MED ORDER — ALBUTEROL SULFATE (5 MG/ML) 0.5% IN NEBU
5.0000 mg | INHALATION_SOLUTION | Freq: Once | RESPIRATORY_TRACT | Status: AC
  Administered 2011-04-13: 5 mg via RESPIRATORY_TRACT
  Filled 2011-04-13: qty 1

## 2011-04-13 MED ORDER — ALBUTEROL SULFATE HFA 108 (90 BASE) MCG/ACT IN AERS
2.0000 | INHALATION_SPRAY | RESPIRATORY_TRACT | Status: DC | PRN
  Administered 2011-04-13: 2 via RESPIRATORY_TRACT
  Filled 2011-04-13: qty 6.7

## 2011-04-13 NOTE — ED Notes (Signed)
MD at bedside. 

## 2011-04-13 NOTE — ED Notes (Signed)
Pt NAD, resp e/u, AOx4, pt states understanding of discharge instructions and denies questions at time of discarge

## 2011-04-13 NOTE — ED Provider Notes (Signed)
History     CSN: [id number]  Arrival date & time 04/13/11  0133   First MD Initiated Contact with Patient 04/13/11 0141      Chief Complaint  Patient presents with  . Shortness of Breath     HPI  History provided by the patient. Patient is a 30 year old female who reports having increasing cough, wheezing and shortness of breath for the past 3-4 days. Patient reports having similar symptoms in the past with relief after using a family member's albuterol inhaler. He reports that her brothers and parents have histories of asthma but that she does not have any formal diagnosis. She currently does not have any primary care provider. Patient is a nonsmoker. Patient denies any productive cough. She denies any hemoptysis. Patient denies any fever, chills, sweats. Patient has no recent travel or prolonged sitting. Patient has no history of cancers. Patient has no history of estrogen or birth control use. Patient has no history of DVT. Patient denies any recent extremity pain or swelling. Patient has no other significant past medical history.    Past Medical History  Diagnosis Date  . Shortness of breath   . GERD (gastroesophageal reflux disease)   . Umbilical hernia   . Asthma     Past Surgical History  Procedure Date  . No past surgeries     Family History  Problem Relation Age of Onset  . Diabetes Maternal Aunt   . Heart disease Maternal Aunt   . Hypertension Maternal Aunt   . Cancer Paternal Aunt   . Arthritis Paternal Grandmother   . Diabetes Paternal Grandmother     History  Substance Use Topics  . Smoking status: Never Smoker   . Smokeless tobacco: Never Used  . Alcohol Use: No    OB History    Grav Para Term Preterm Abortions TAB SAB Ect Mult Living   0               Review of Systems  Constitutional: Negative for fever and chills.  Respiratory: Positive for cough, shortness of breath and wheezing.   Cardiovascular: Positive for chest pain. Negative for  palpitations and leg swelling.  Gastrointestinal: Negative for nausea, vomiting and abdominal pain.  All other systems reviewed and are negative.    Allergies  Other  Home Medications   Current Outpatient Rx  Name Route Sig Dispense Refill  . ALBUTEROL SULFATE HFA IN Inhalation Inhale 2 puffs into the lungs every 4 (four) hours as needed. For shortness of breath       BP 120/87  Pulse 101  Temp(Src) 98.4 F (36.9 C) (Oral)  Resp 18  SpO2 96%  LMP 03/18/2011  Physical Exam  Nursing note and vitals reviewed. Constitutional: She is oriented to person, place, and time. She appears well-developed and well-nourished. No distress.  HENT:  Head: Normocephalic.  Mouth/Throat: Oropharynx is clear and moist.  Cardiovascular: Normal rate and regular rhythm.   Pulmonary/Chest: Effort normal. No respiratory distress. She has wheezes. She has no rales.  Abdominal: Soft. She exhibits no distension. There is no tenderness. There is no rebound.  Musculoskeletal: She exhibits no edema and no tenderness.  Neurological: She is alert and oriented to person, place, and time.  Skin: Skin is warm and dry. No rash noted.  Psychiatric: She has a normal mood and affect. Her behavior is normal.    ED Course  Procedures     1. Asthma       MDM  Patient seen  and evaluated. Patient in no acute distress. Diffuse wheezing on exam we'll treat with albuterol and Atrovent.  She reports feeling better after breathing treatment. Wheezing has improved some.  Patient was some tachycardia after albuterol treatment. She denies any chest pains at this time.   Date: 04/13/2011  Rate: 101  Rhythm: sinus tachycardia  QRS Axis: normal  Intervals: normal  ST/T Wave abnormalities: normal  Conduction Disutrbances:none  Narrative Interpretation:   Old EKG Reviewed: No significant change from 02/15/2008. Slight tachycardia today          Angus Seller, [place] 04/13/11 [phone]

## 2011-04-13 NOTE — ED Notes (Signed)
The pt has had some difficulty breathing for the past 3 days.  She has been out of her inhaler for the same amount of time

## 2011-04-13 NOTE — ED Provider Notes (Signed)
Medical screening examination/treatment/procedure(s) were performed by non-physician practitioner and as supervising physician I was immediately available for consultation/collaboration.   Vanderbilt Ranieri W Marga Gramajo, MD 04/13/11 0755 

## 2011-04-26 ENCOUNTER — Encounter (HOSPITAL_COMMUNITY): Payer: Self-pay | Admitting: Emergency Medicine

## 2011-04-26 ENCOUNTER — Emergency Department (INDEPENDENT_AMBULATORY_CARE_PROVIDER_SITE_OTHER)
Admission: EM | Admit: 2011-04-26 | Discharge: 2011-04-26 | Disposition: A | Discharge status: Home / Self Care | Payer: Self-pay | Source: Home / Self Care | Attending: Family Medicine | Admitting: Family Medicine

## 2011-04-26 DIAGNOSIS — S0300XA Dislocation of jaw, unspecified side, initial encounter: Secondary | ICD-10-CM

## 2011-04-26 MED ORDER — IBUPROFEN 800 MG PO TABS
800.0000 mg | ORAL_TABLET | Freq: Once | ORAL | Status: AC
  Administered 2011-04-26: 800 mg via ORAL

## 2011-04-26 MED ORDER — LORAZEPAM 2 MG/ML IJ SOLN
2.0000 mg | Freq: Once | INTRAMUSCULAR | Status: AC
  Administered 2011-04-26: 2 mg via INTRAMUSCULAR

## 2011-04-26 MED ORDER — IBUPROFEN 600 MG PO TABS: 600.0000 mg | ORAL_TABLET | Freq: Four times a day (QID) | ORAL | Status: AC | PRN

## 2011-04-26 MED ORDER — LORAZEPAM 2 MG/ML IJ SOLN
INTRAMUSCULAR | Status: AC
  Filled 2011-04-26: qty 1

## 2011-04-26 MED ORDER — IBUPROFEN 800 MG PO TABS
ORAL_TABLET | ORAL | Status: AC
  Filled 2011-04-26: qty 1

## 2011-04-26 MED ORDER — CYCLOBENZAPRINE HCL 10 MG PO TABS: 10.0000 mg | ORAL_TABLET | Freq: Two times a day (BID) | ORAL | Status: AC | PRN

## 2011-04-26 NOTE — ED Provider Notes (Signed)
History     CSN: [id number]  Arrival date & time 04/26/11  1413   First MD Initiated Contact with Patient 04/26/11 1511      Chief Complaint  Patient presents with  . Temporomandibular Joint Pain  . Shortness of Breath    (Consider location/radiation/quality/duration/timing/severity/associated sxs/prior treatment) HPI Comments: 31 y/o female h/o GERD and asthma here c/o locked jaw in open position after yawning almost 3 hours ago. Very uncomfortable unable to close her mouth or swallow and needs to keep bended forward to let saliva to run out of her mouth and is worried about shocking. Denies trauma or sore throat, fever, cough etc. Has brief similar episode that resolved spontaneously years ago.   Past Medical History  Diagnosis Date  . Shortness of breath   . GERD (gastroesophageal reflux disease)   . Umbilical hernia   . Asthma     Past Surgical History  Procedure Date  . No past surgeries     Family History  Problem Relation Age of Onset  . Diabetes Maternal Aunt   . Heart disease Maternal Aunt   . Hypertension Maternal Aunt   . Cancer Paternal Aunt   . Arthritis Paternal Grandmother   . Diabetes Paternal Grandmother     History  Substance Use Topics  . Smoking status: Never Smoker   . Smokeless tobacco: Never Used  . Alcohol Use: No    OB History    Grav Para Term Preterm Abortions TAB SAB Ect Mult Living   0               Review of Systems  Constitutional: Negative.   HENT: Positive for drooling and trouble swallowing. Negative for ear pain, sore throat, facial swelling, neck pain, neck stiffness and ear discharge.   Respiratory: Negative for cough and shortness of breath.   Psychiatric/Behavioral: The patient is nervous/anxious.   All other systems reviewed and are negative.    Allergies  Other  Home Medications   Current Outpatient Rx  Name Route Sig Dispense Refill  . ALBUTEROL SULFATE HFA IN Inhalation Inhale 2 puffs into the lungs  every 4 (four) hours as needed. For shortness of breath     . CYCLOBENZAPRINE HCL 10 MG PO TABS Oral Take 1 tablet (10 mg total) by mouth 2 (two) times daily as needed for muscle spasms. 20 tablet 0  . IBUPROFEN 600 MG PO TABS Oral Take 1 tablet (600 mg total) by mouth every 6 (six) hours as needed for pain. 30 tablet 0    BP 130/91  Pulse 90  Temp(Src) 99 F (37.2 C) (Oral)  Resp 16  SpO2 100%  LMP 03/18/2011  Physical Exam  Nursing note and vitals reviewed. Constitutional: She is oriented to person, place, and time. She appears well-developed and well-nourished. She appears distressed.       anxious uncomfortable.  HENT:  Head: Normocephalic and atraumatic.       No face swelling erythema or bruising. No skin abrasions or cuts. Mouth wide open with increased tone of masseter muscles bilaterally. Tenderness over TMJ bilateral with bilateral subluxation. Unable to rotate to close, move anterior or posteriorly her lower jaw or to open more her mouth.  TMs and ear canal normal. OP clear no swelling and clear air passage. Patient is drooling continuously.  Neck: Normal range of motion. Neck supple. No JVD present. No tracheal deviation present. No thyromegaly present.  Cardiovascular: Normal heart sounds.   Pulmonary/Chest: Effort normal and breath sounds  normal. No respiratory distress. She has no wheezes. She has no rales. She exhibits no tenderness.  Lymphadenopathy:    She has no cervical adenopathy.  Neurological: She is alert and oriented to person, place, and time.    ED Course  Reduction of dislocation Date/Time: 04/26/2011 4:30 PM Performed by: Sharin Grave Authorized by: Sharin Grave Consent: Verbal consent obtained. Consent given by: patient Patient understanding: patient states understanding of the procedure being performed Local anesthesia used: no Patient sedated: patient had 2mg  ativan IM administered prior procedure. Patient tolerance: Patient  tolerated the procedure well with no immediate complications. Comments: I prep dressing both my thumbs with flexible gauze and covering hands with regular non latex globes. No gauzes were placed inside of patient mouth. Lower jaw subluxation was reduced at second attempt by applying downward pressure of the lower jaw with both my thumbs at the point of bilateral posterior molars from inside the mouth and upward pressure with rocking motion of the patient chin from outside with my index and middle fingers from both hands. Pt reported immediate relief after was able to close her mouth. A hard collar was apply to keep jaw from opening wide for 3 weeks.    (including critical care time)  Labs Reviewed - No data to display No results found.   1. TMJ (dislocation of temporomandibular joint)       MDM  After successful reduction of bilateral TMJ subluxation. NSAIDs, muscle relaxant, supportive measures and follow up with maxillofacial specialist reccommended.        Sharin Grave, MD 04/27/11 1121

## 2011-04-26 NOTE — ED Notes (Signed)
HERE WITH SUDDEN ONSET OF BILAT LOCKED JAWS THAT STARTED X2 HRS AGO WITH YAWNING.PT STATES SHE HAD SIMILAR SX BUT DIDN'T LAST THIS LONG.ALSO C/O SOB DUE TO INCREASED DROOLING.SATS 100% R/A.CONSTANT SHARP PAINS NOTED

## 2011-04-26 NOTE — ED Notes (Signed)
POST ATIVAN 2MG  IM PT STILL C/O LOCKED JAWS.NO DISTRESS

## 2011-04-26 NOTE — ED Notes (Signed)
PT GIVEN 1MG  ATIVAN X 2 VIA LEFT DELTOID AFTER SEEING ORDER FOR 2MG 

## 2011-06-04 ENCOUNTER — Emergency Department (HOSPITAL_COMMUNITY)
Admission: EM | Admit: 2011-06-04 | Discharge: 2011-06-04 | Disposition: A | Discharge status: Home / Self Care | Payer: Self-pay | Attending: Emergency Medicine | Admitting: Emergency Medicine

## 2011-06-04 ENCOUNTER — Encounter (HOSPITAL_COMMUNITY): Payer: Self-pay | Admitting: *Deleted

## 2011-06-04 DIAGNOSIS — K219 Gastro-esophageal reflux disease without esophagitis: Secondary | ICD-10-CM | POA: Insufficient documentation

## 2011-06-04 DIAGNOSIS — R0602 Shortness of breath: Secondary | ICD-10-CM | POA: Insufficient documentation

## 2011-06-04 DIAGNOSIS — J45909 Unspecified asthma, uncomplicated: Secondary | ICD-10-CM | POA: Insufficient documentation

## 2011-06-04 MED ORDER — ALBUTEROL SULFATE HFA 108 (90 BASE) MCG/ACT IN AERS
2.0000 | INHALATION_SPRAY | Freq: Once | RESPIRATORY_TRACT | Status: AC
  Administered 2011-06-04: 2 via RESPIRATORY_TRACT
  Filled 2011-06-04: qty 6.7

## 2011-06-04 MED ORDER — PREDNISONE 20 MG PO TABS
60.0000 mg | ORAL_TABLET | Freq: Once | ORAL | Status: AC
  Administered 2011-06-04: 60 mg via ORAL
  Filled 2011-06-04: qty 3

## 2011-06-04 MED ORDER — ALBUTEROL SULFATE HFA 108 (90 BASE) MCG/ACT IN AERS: 2.0000 | INHALATION_SPRAY | RESPIRATORY_TRACT | Status: DC | PRN

## 2011-06-04 MED ORDER — PREDNISONE 50 MG PO TABS: 50.0000 mg | ORAL_TABLET | Freq: Every day | ORAL | Status: DC

## 2011-06-04 NOTE — ED Notes (Signed)
Pt reports shortness of breath intermittently-pt reports using her albuterol inhaler-pt reports she's about to ran out of her inhaler and is requesting to have rx for a nebulizer.

## 2011-06-04 NOTE — Discharge Instructions (Signed)
Albuterol inhaler 2 puffs every 4 hrs for the next 3 days then as needed. Prednisone as prescribed daily. ZYrtec, or sudafed for congestion. Follow up with primary care doctor.   Asthma Attack Prevention HOW CAN ASTHMA BE PREVENTED? Currently, there is no way to prevent asthma from starting. However, you can take steps to control the disease and prevent its symptoms after you have been diagnosed. Learn about your asthma and how to control it. Take an active role to control your asthma by working with your caregiver to create and follow an asthma action plan. An asthma action plan guides you in taking your medicines properly, avoiding factors that make your asthma worse, tracking your level of asthma control, responding to worsening asthma, and seeking emergency care when needed. To track your asthma, keep records of your symptoms, check your peak flow number using a peak flow meter (handheld device that shows how well air moves out of your lungs), and get regular asthma checkups.  Other ways to prevent asthma attacks include:  Use medicines as your caregiver directs.   Identify and avoid things that make your asthma worse (as much as you can).   Keep track of your asthma symptoms and level of control.   Get regular checkups for your asthma.   With your caregiver, write a detailed plan for taking medicines and managing an asthma attack. Then be sure to follow your action plan. Asthma is an ongoing condition that needs regular monitoring and treatment.   Identify and avoid asthma triggers. A number of outdoor allergens and irritants (pollen, mold, cold air, air pollution) can trigger asthma attacks. Find out what causes or makes your asthma worse, and take steps to avoid those triggers (see below).   Monitor your breathing. Learn to recognize warning signs of an attack, such as slight coughing, wheezing or shortness of breath. However, your lung function may already decrease before you notice any  signs or symptoms, so regularly measure and record your peak airflow with a home peak flow meter.   Identify and treat attacks early. If you act quickly, you're less likely to have a severe attack. You will also need less medicine to control your symptoms. When your peak flow measurements decrease and alert you to an upcoming attack, take your medicine as instructed, and immediately stop any activity that may have triggered the attack. If your symptoms do not improve, get medical help.   Pay attention to increasing quick-relief inhaler use. If you find yourself relying on your quick-relief inhaler (such as albuterol), your asthma is not under control. See your caregiver about adjusting your treatment.  IDENTIFY AND CONTROL FACTORS THAT MAKE YOUR ASTHMA WORSE A number of common things can set off or make your asthma symptoms worse (asthma triggers). Keep track of your asthma symptoms for several weeks, detailing all the environmental and emotional factors that are linked with your asthma. When you have an asthma attack, go back to your asthma diary to see which factor, or combination of factors, might have contributed to it. Once you know what these factors are, you can take steps to control many of them.  Allergies: If you have allergies and asthma, it is important to take asthma prevention steps at home. Asthma attacks (worsening of asthma symptoms) can be triggered by allergies, which can cause temporary increased inflammation of your airways. Minimizing contact with the substance to which you are allergic will help prevent an asthma attack. Animal Dander:   Some people are allergic  to the flakes of skin or dried saliva from animals with fur or feathers. Keep these pets out of your home.   If you can't keep a pet outdoors, keep the pet out of your bedroom and other sleeping areas at all times, and keep the door closed.   Remove carpets and furniture covered with cloth from your home. If that is not  possible, keep the pet away from fabric-covered furniture and carpets.  Dust Mites:  Many people with asthma are allergic to dust mites. Dust mites are tiny bugs that are found in every home, in mattresses, pillows, carpets, fabric-covered furniture, bedcovers, clothes, stuffed toys, fabric, and other fabric-covered items.   Cover your mattress in a special dust-proof cover.   Cover your pillow in a special dust-proof cover, or wash the pillow each week in hot water. Water must be hotter than 130 F to kill dust mites. Cold or warm water used with detergent and bleach can also be effective.   Wash the sheets and blankets on your bed each week in hot water.   Try not to sleep or lie on cloth-covered cushions.   Call ahead when traveling and ask for a smoke-free hotel room. Bring your own bedding and pillows, in case the hotel only supplies feather pillows and down comforters, which may contain dust mites and cause asthma symptoms.   Remove carpets from your bedroom and those laid on concrete, if you can.   Keep stuffed toys out of the bed, or wash the toys weekly in hot water or cooler water with detergent and bleach.  Cockroaches:  Many people with asthma are allergic to the droppings and remains of cockroaches.   Keep food and garbage in closed containers. Never leave food out.   Use poison baits, traps, powders, gels, or paste (for example, boric acid).   If a spray is used to kill cockroaches, stay out of the room until the odor goes away.  Indoor Mold:  Fix leaky faucets, pipes, or other sources of water that have mold around them.   Clean moldy surfaces with a cleaner that has bleach in it.  Pollen and Outdoor Mold:  When pollen or mold spore counts are high, try to keep your windows closed.   Stay indoors with windows closed from late morning to afternoon, if you can. Pollen and some mold spore counts are highest at that time.   Ask your caregiver whether you need to take  or increase anti-inflammatory medicine before your allergy season starts.  Irritants:   Tobacco smoke is an irritant. If you smoke, ask your caregiver how you can quit. Ask family members to quit smoking, too. Do not allow smoking in your home or car.   If possible, do not use a wood-burning stove, kerosene heater, or fireplace. Minimize exposure to all sources of smoke, including incense, candles, fires, and fireworks.   Try to stay away from strong odors and sprays, such as perfume, talcum powder, hair spray, and paints.   Decrease humidity in your home and use an indoor air cleaning device. Reduce indoor humidity to below 60 percent. Dehumidifiers or central air conditioners can do this.   Try to have someone else vacuum for you once or twice a week, if you can. Stay out of rooms while they are being vacuumed and for a short while afterward.   If you vacuum, use a dust mask from a hardware store, a double-layered or microfilter vacuum cleaner bag, or a vacuum cleaner  with a HEPA filter.   Sulfites in foods and beverages can be irritants. Do not drink beer or wine, or eat dried fruit, processed potatoes, or shrimp if they cause asthma symptoms.   Cold air can trigger an asthma attack. Cover your nose and mouth with a scarf on cold or windy days.   Several health conditions can make asthma more difficult to manage, including runny nose, sinus infections, reflux disease, psychological stress, and sleep apnea. Your caregiver will treat these conditions, as well.   Avoid close contact with people who have a cold or the flu, since your asthma symptoms may get worse if you catch the infection from them. Wash your hands thoroughly after touching items that may have been handled by people with a respiratory infection.   Get a flu shot every year to protect against the flu virus, which often makes asthma worse for days or weeks. Also get a pneumonia shot once every five to 10 years.   Drugs:  Aspirin and other painkillers can cause asthma attacks. 10% to 20% of people with asthma have sensitivity to aspirin or a group of painkillers called non-steroidal anti-inflammatory drugs (NSAIDS), such as ibuprofen and naproxen. These drugs are used to treat pain and reduce fevers. Asthma attacks caused by any of these medicines can be severe and even fatal. These drugs must be avoided in people who have known aspirin sensitive asthma. Products with acetaminophen are considered safe for people who have asthma. It is important that people with aspirin sensitivity read labels of all over-the-counter drugs used to treat pain, colds, coughs, and fever.   Beta blockers and ACE inhibitors are other drugs which you should discuss with your caregiver, in relation to your asthma.  ALLERGY SKIN TESTING  Ask your asthma caregiver about allergy skin testing or blood testing (RAST test) to identify the allergens to which you are sensitive. If you are found to have allergies, allergy shots (immunotherapy) for asthma may help prevent future allergies and asthma. With allergy shots, small doses of allergens (substances to which you are allergic) are injected under your skin on a regular schedule. Over a period of time, your body may become used to the allergen and less responsive with asthma symptoms. You can also take measures to minimize your exposure to those allergens. EXERCISE  If you have exercise-induced asthma, or are planning vigorous exercise, or exercise in cold, humid, or dry environments, prevent exercise-induced asthma by following your caregiver's advice regarding asthma treatment before exercising. Document Released: 03/19/2009 Document Revised: 12/11/2010 Document Reviewed: 03/19/2009 [facility] Patient Information 2012 [place], [place].

## 2011-06-04 NOTE — ED Provider Notes (Signed)
History     CSN: [id number]  Arrival date & time 06/04/11  1224   First MD Initiated Contact with Patient 06/04/11 1330      Chief Complaint  Patient presents with  . Shortness of Breath    (Consider location/radiation/quality/duration/timing/severity/associated sxs/prior treatment) Patient is a 31 y.o. female presenting with shortness of breath. The history is provided by the patient.  Shortness of Breath  The current episode started 3 to 5 days ago. The onset was sudden. Associated symptoms include cough and shortness of breath. Pertinent negatives include no chest pain and no fever.  Pt states she has history of asthma. States she has had URI symptoms which triggered her asthma. States she does not have an inhaler. States she has dry non productive cough. Denies fever, chills. States her lungs "feel tight."   Past Medical History  Diagnosis Date  . Shortness of breath   . GERD (gastroesophageal reflux disease)   . Umbilical hernia   . Asthma     Past Surgical History  Procedure Date  . No past surgeries     Family History  Problem Relation Age of Onset  . Diabetes Maternal Aunt   . Heart disease Maternal Aunt   . Hypertension Maternal Aunt   . Cancer Paternal Aunt   . Arthritis Paternal Grandmother   . Diabetes Paternal Grandmother     History  Substance Use Topics  . Smoking status: Never Smoker   . Smokeless tobacco: Never Used  . Alcohol Use: No    OB History    Grav Para Term Preterm Abortions TAB SAB Ect Mult Living   0               Review of Systems  Constitutional: Negative for fever and chills.  HENT: Negative.   Eyes: Negative.   Respiratory: Positive for cough, chest tightness and shortness of breath.   Cardiovascular: Negative for chest pain, palpitations and leg swelling.  Gastrointestinal: Negative.   Musculoskeletal: Negative.   Skin: Negative.   Neurological: Negative.   Psychiatric/Behavioral: Negative.     Allergies   Other  Home Medications   Current Outpatient Rx  Name Route Sig Dispense Refill  . ALBUTEROL SULFATE HFA IN Inhalation Inhale 2 puffs into the lungs every 4 (four) hours as needed. For shortness of breath     . VITAMIN D 1000 UNITS PO TABS Oral Take 1,000 Units by mouth daily.    Marland Kitchen SIMETHICONE 80 MG PO CHEW Oral Chew 80 mg by mouth every 6 (six) hours as needed. For gas      BP 117/85  Pulse 103  Temp(Src) 98.7 F (37.1 C) (Oral)  Resp 18  Ht 5\' 6"  (1.676 m)  SpO2 100%  LMP 05/16/2011  Physical Exam  Nursing note and vitals reviewed. Constitutional: She is oriented to person, place, and time. She appears well-developed and well-nourished.  HENT:  Head: Normocephalic.  Right Ear: Tympanic membrane, external ear and ear canal normal.  Left Ear: Tympanic membrane, external ear and ear canal normal.  Nose: Rhinorrhea present.  Mouth/Throat: Uvula is midline, oropharynx is clear and moist and mucous membranes are normal.  Eyes: Conjunctivae are normal.  Neck: Neck supple.  Cardiovascular: Normal rate, regular rhythm and normal heart sounds.   Pulmonary/Chest: Effort normal.       Decreased air movement bilaterally. No frank wheezing  Abdominal: Soft. Bowel sounds are normal. She exhibits no distension.  Musculoskeletal: Normal range of motion. She exhibits no edema.  Neurological: She is alert and oriented to person, place, and time.  Skin: Skin is warm and dry.    ED Course  Procedures (including critical care time)  Pt with URI symptoms and asthma exacerbation. VS here normal other then slight tachycardia of 103. Will d/c home with an inhaler, steroid dose pack. Pt afebrile, non productive cough, no cp, doubt pneumonia.  No diagnosis found.    MDM          Lottie Mussel, PA 06/04/11 1346

## 2011-06-04 NOTE — ED Provider Notes (Signed)
Medical screening examination/treatment/procedure(s) were performed by non-physician practitioner and as supervising physician I was immediately available for consultation/collaboration.  Doug Sou, MD 06/04/11 [phone]

## 2011-08-17 ENCOUNTER — Emergency Department (HOSPITAL_COMMUNITY)
Admission: EM | Admit: 2011-08-17 | Discharge: 2011-08-18 | Disposition: A | Discharge status: Home / Self Care | Payer: Self-pay | Attending: Emergency Medicine | Admitting: Emergency Medicine

## 2011-08-17 ENCOUNTER — Encounter (HOSPITAL_COMMUNITY): Payer: Self-pay | Admitting: *Deleted

## 2011-08-17 DIAGNOSIS — J45901 Unspecified asthma with (acute) exacerbation: Secondary | ICD-10-CM | POA: Insufficient documentation

## 2011-08-17 MED ORDER — ALBUTEROL SULFATE (5 MG/ML) 0.5% IN NEBU
5.0000 mg | INHALATION_SOLUTION | Freq: Once | RESPIRATORY_TRACT | Status: AC
  Administered 2011-08-18: 5 mg via RESPIRATORY_TRACT
  Filled 2011-08-17: qty 1

## 2011-08-17 NOTE — ED Notes (Signed)
C/o sob, chest tightness, spitting up phlegm, cough. H/o asthma. sx onset ~ 2d ago, (denies: fever nvd), "Rx for albuterol is expired". No meds PTA.

## 2011-08-18 MED ORDER — ALBUTEROL SULFATE HFA 108 (90 BASE) MCG/ACT IN AERS: 2.0000 | INHALATION_SPRAY | RESPIRATORY_TRACT | Status: AC | PRN

## 2011-08-18 MED ORDER — PREDNISONE 20 MG PO TABS: 60.0000 mg | ORAL_TABLET | Freq: Every day | ORAL | Status: AC

## 2011-08-18 MED ORDER — PREDNISONE 20 MG PO TABS
60.0000 mg | ORAL_TABLET | Freq: Once | ORAL | Status: AC
  Administered 2011-08-18: 60 mg via ORAL
  Filled 2011-08-18: qty 3

## 2011-08-18 MED ORDER — ALBUTEROL SULFATE HFA 108 (90 BASE) MCG/ACT IN AERS: 2.0000 | INHALATION_SPRAY | RESPIRATORY_TRACT | Status: DC

## 2011-08-18 MED ORDER — IPRATROPIUM BROMIDE 0.02 % IN SOLN
0.5000 mg | Freq: Once | RESPIRATORY_TRACT | Status: AC
  Administered 2011-08-18: 0.5 mg via RESPIRATORY_TRACT
  Filled 2011-08-18: qty 2.5

## 2011-08-18 NOTE — ED Notes (Signed)
Rx x 2, pt voiced understanding to f/u with PCP and return for worsening SOB.

## 2011-08-18 NOTE — Discharge Instructions (Signed)
Take medication as prescribed. Followup with your doctor for recheck in 2-3 days. Return to the emergency room for worsening condition or concerning symptoms.  Asthma Attack Prevention HOW CAN ASTHMA BE PREVENTED? Currently, there is no way to prevent asthma from starting. However, you can take steps to control the disease and prevent its symptoms after you have been diagnosed. Learn about your asthma and how to control it. Take an active role to control your asthma by working with your caregiver to create and follow an asthma action plan. An asthma action plan guides you in taking your medicines properly, avoiding factors that make your asthma worse, tracking your level of asthma control, responding to worsening asthma, and seeking emergency care when needed. To track your asthma, keep records of your symptoms, check your peak flow number using a peak flow meter (handheld device that shows how well air moves out of your lungs), and get regular asthma checkups.  Other ways to prevent asthma attacks include:  Use medicines as your caregiver directs.   Identify and avoid things that make your asthma worse (as much as you can).   Keep track of your asthma symptoms and level of control.   Get regular checkups for your asthma.   With your caregiver, write a detailed plan for taking medicines and managing an asthma attack. Then be sure to follow your action plan. Asthma is an ongoing condition that needs regular monitoring and treatment.   Identify and avoid asthma triggers. A number of outdoor allergens and irritants (pollen, mold, cold air, air pollution) can trigger asthma attacks. Find out what causes or makes your asthma worse, and take steps to avoid those triggers (see below).   Monitor your breathing. Learn to recognize warning signs of an attack, such as slight coughing, wheezing or shortness of breath. However, your lung function may already decrease before you notice any signs or symptoms, so  regularly measure and record your peak airflow with a home peak flow meter.   Identify and treat attacks early. If you act quickly, you're less likely to have a severe attack. You will also need less medicine to control your symptoms. When your peak flow measurements decrease and alert you to an upcoming attack, take your medicine as instructed, and immediately stop any activity that may have triggered the attack. If your symptoms do not improve, get medical help.   Pay attention to increasing quick-relief inhaler use. If you find yourself relying on your quick-relief inhaler (such as albuterol), your asthma is not under control. See your caregiver about adjusting your treatment.  IDENTIFY AND CONTROL FACTORS THAT MAKE YOUR ASTHMA WORSE A number of common things can set off or make your asthma symptoms worse (asthma triggers). Keep track of your asthma symptoms for several weeks, detailing all the environmental and emotional factors that are linked with your asthma. When you have an asthma attack, go back to your asthma diary to see which factor, or combination of factors, might have contributed to it. Once you know what these factors are, you can take steps to control many of them.  Allergies: If you have allergies and asthma, it is important to take asthma prevention steps at home. Asthma attacks (worsening of asthma symptoms) can be triggered by allergies, which can cause temporary increased inflammation of your airways. Minimizing contact with the substance to which you are allergic will help prevent an asthma attack. Animal Dander:   Some people are allergic to the flakes of skin or dried  saliva from animals with fur or feathers. Keep these pets out of your home.   If you can't keep a pet outdoors, keep the pet out of your bedroom and other sleeping areas at all times, and keep the door closed.   Remove carpets and furniture covered with cloth from your home. If that is not possible, keep the pet  away from fabric-covered furniture and carpets.  Dust Mites:  Many people with asthma are allergic to dust mites. Dust mites are tiny bugs that are found in every home, in mattresses, pillows, carpets, fabric-covered furniture, bedcovers, clothes, stuffed toys, fabric, and other fabric-covered items.   Cover your mattress in a special dust-proof cover.   Cover your pillow in a special dust-proof cover, or wash the pillow each week in hot water. Water must be hotter than 130 F to kill dust mites. Cold or warm water used with detergent and bleach can also be effective.   Wash the sheets and blankets on your bed each week in hot water.   Try not to sleep or lie on cloth-covered cushions.   Call ahead when traveling and ask for a smoke-free hotel room. Bring your own bedding and pillows, in case the hotel only supplies feather pillows and down comforters, which may contain dust mites and cause asthma symptoms.   Remove carpets from your bedroom and those laid on concrete, if you can.   Keep stuffed toys out of the bed, or wash the toys weekly in hot water or cooler water with detergent and bleach.  Cockroaches:  Many people with asthma are allergic to the droppings and remains of cockroaches.   Keep food and garbage in closed containers. Never leave food out.   Use poison baits, traps, powders, gels, or paste (for example, boric acid).   If a spray is used to kill cockroaches, stay out of the room until the odor goes away.  Indoor Mold:  Fix leaky faucets, pipes, or other sources of water that have mold around them.   Clean moldy surfaces with a cleaner that has bleach in it.  Pollen and Outdoor Mold:  When pollen or mold spore counts are high, try to keep your windows closed.   Stay indoors with windows closed from late morning to afternoon, if you can. Pollen and some mold spore counts are highest at that time.   Ask your caregiver whether you need to take or increase  anti-inflammatory medicine before your allergy season starts.  Irritants:   Tobacco smoke is an irritant. If you smoke, ask your caregiver how you can quit. Ask family members to quit smoking, too. Do not allow smoking in your home or car.   If possible, do not use a wood-burning stove, kerosene heater, or fireplace. Minimize exposure to all sources of smoke, including incense, candles, fires, and fireworks.   Try to stay away from strong odors and sprays, such as perfume, talcum powder, hair spray, and paints.   Decrease humidity in your home and use an indoor air cleaning device. Reduce indoor humidity to below 60 percent. Dehumidifiers or central air conditioners can do this.   Try to have someone else vacuum for you once or twice a week, if you can. Stay out of rooms while they are being vacuumed and for a short while afterward.   If you vacuum, use a dust mask from a hardware store, a double-layered or microfilter vacuum cleaner bag, or a vacuum cleaner with a HEPA filter.   Sulfites  in foods and beverages can be irritants. Do not drink beer or wine, or eat dried fruit, processed potatoes, or shrimp if they cause asthma symptoms.   Cold air can trigger an asthma attack. Cover your nose and mouth with a scarf on cold or windy days.   Several health conditions can make asthma more difficult to manage, including runny nose, sinus infections, reflux disease, psychological stress, and sleep apnea. Your caregiver will treat these conditions, as well.   Avoid close contact with people who have a cold or the flu, since your asthma symptoms may get worse if you catch the infection from them. Wash your hands thoroughly after touching items that may have been handled by people with a respiratory infection.   Get a flu shot every year to protect against the flu virus, which often makes asthma worse for days or weeks. Also get a pneumonia shot once every five to 10 years.  Drugs:  Aspirin and other  painkillers can cause asthma attacks. 10% to 20% of people with asthma have sensitivity to aspirin or a group of painkillers called non-steroidal anti-inflammatory drugs (NSAIDS), such as ibuprofen and naproxen. These drugs are used to treat pain and reduce fevers. Asthma attacks caused by any of these medicines can be severe and even fatal. These drugs must be avoided in people who have known aspirin sensitive asthma. Products with acetaminophen are considered safe for people who have asthma. It is important that people with aspirin sensitivity read labels of all over-the-counter drugs used to treat pain, colds, coughs, and fever.   Beta blockers and ACE inhibitors are other drugs which you should discuss with your caregiver, in relation to your asthma.  ALLERGY SKIN TESTING  Ask your asthma caregiver about allergy skin testing or blood testing (RAST test) to identify the allergens to which you are sensitive. If you are found to have allergies, allergy shots (immunotherapy) for asthma may help prevent future allergies and asthma. With allergy shots, small doses of allergens (substances to which you are allergic) are injected under your skin on a regular schedule. Over a period of time, your body may become used to the allergen and less responsive with asthma symptoms. You can also take measures to minimize your exposure to those allergens. EXERCISE  If you have exercise-induced asthma, or are planning vigorous exercise, or exercise in cold, humid, or dry environments, prevent exercise-induced asthma by following your caregiver's advice regarding asthma treatment before exercising. Document Released: 03/19/2009 Document Revised: 03/20/2011 Document Reviewed: 03/19/2009 [facility] Patient Information 2012 [place], [place].   RESOURCE GUIDE  Dental Problems  Patients with Medicaid: [facility] [phone]                                                                    [phone] Phone:  [phone]  Phone:  [phone]  If unable to pay or uninsured, contact:  [facility] or [facility]. to become qualified for the adult dental clinic.  Chronic Pain Problems Contact [facility]  [phone] Patients need to be referred by their primary care doctor.  Insufficient Money for Medicine Contact United Way:  call "211" or [facility] 9153367063.  No Primary Care Doctor Call [facility]  [phone] Other agencies that provide inexpensive medical care    [facility]  [phone]    [facility]  [phone]    [facility]  [phone]    [facility]  [phone]    [facility]anned Parenthood  4181906759    [facility]  [phone]  Psychological Services [facility]  [phone] [facility]  [phone] [facility]   [phone] (emergency services [phone])  Abuse/Neglect [facility] Child Abuse Hotline (201)828-9362 [facility] Child Abuse Hotline (872) 828-8524 (After Hours)  Emergency Shelter [facility] (818) [phone]  Maternity Homes Room at the [place] [facility] 404-285-7194 [facility] 6160212647  MRSA Hotline #:   6057931354  [facility] Resources  [facility] [place]     [employer]                          [facility] Dept. [address] [place]                        [address]          [address]  Blondell Reveal Phone:  [phone]                                     Phone:  [phone]                   Phone:  [phone]  [facility] Phone:  [phone]  [facility] Child Abuse Hotline 810-634-3410 671-583-1333 (After Hours)       *free text    If you develop symptoms of Shortness of Breath, Chest Pain, Swelling of lips, mouth or tongue or if your condition becomes worse with any new symptoms, see your doctor or return to the Emergency Department for immediate care. Emergency services are not intended to be a substitute for comprehensive medical attention.  Please contact your doctor for follow up if not improving as expected.   Call your doctor in 5-7 days or as directed if there is no improvement.   Community Resources: *IF YOU ARE IN IMMEDIATE DANGER CALL 911!  Abuse/Neglect:  Family Services Crisis Hotline [facility]): 929-784-2198 Center Against Violence [facility]): 305-108-0373  After hours, holidays and weekends: 608-442-8496 National Domestic Violence Hotline: 8173754338  Mental Health: [facility] Mental Health: [address]: [phone])  [phone]  Health Clinics:  [facility]): [phone] Monday - Friday 8 AM - 9 PM, Saturday and Sunday 10 AM - 9 PM  [facility] [place]: [phone] Monday - Friday 8 AM - 5 PM  [facility]  [address]: [phone] Monday- Friday 8:30 AM - 5:30 PM, Sat 9 AM - 1 PM  24 HR [facility] [address]: [phone] [facility]: [phone] [facility] [address]: [phone]  24 HR [facility]: [address] [phone]

## 2011-08-18 NOTE — ED Notes (Signed)
Pt. C/o sob due to being out of her meds. Has tried home remedies with no relief. States this exacerbation has lasted for about 24 hours.

## 2011-08-18 NOTE — ED Provider Notes (Signed)
History     CSN: [id number]  Arrival date & time 08/17/11  2340   First MD Initiated Contact with Patient 08/18/11 0016      Chief Complaint  Patient presents with  . Shortness of Breath    (Consider location/radiation/quality/duration/timing/severity/associated sxs/prior treatment) HPI 31 year old female presents emergency department complaining of shortness of breath and wheezing. Patient reports history of asthma. She has recently had an upper respiratory illness for the last 2-3 days. She's run out of her asthma inhaler. Patient reports admission 2 months ago for asthma exacerbation, however records show only visit to the ER at that time. Patient denies fever. Sputum is a yellow-green in color. She's never required intubation or ICU stay for her asthma. Past Medical History  Diagnosis Date  . Shortness of breath   . GERD (gastroesophageal reflux disease)   . Umbilical hernia   . Asthma     Past Surgical History  Procedure Date  . No past surgeries     Family History  Problem Relation Age of Onset  . Diabetes Maternal Aunt   . Heart disease Maternal Aunt   . Hypertension Maternal Aunt   . Cancer Paternal Aunt   . Arthritis Paternal Grandmother   . Diabetes Paternal Grandmother     History  Substance Use Topics  . Smoking status: Never Smoker   . Smokeless tobacco: Never Used  . Alcohol Use: Yes     wine occaisionally    OB History    Grav Para Term Preterm Abortions TAB SAB Ect Mult Living   0               Review of Systems  All other systems reviewed and are negative.    Allergies  Other  Home Medications   Current Outpatient Rx  Name Route Sig Dispense Refill  . VITAMIN D 1000 UNITS PO TABS Oral Take 1,000 Units by mouth daily.    . ALBUTEROL SULFATE HFA 108 (90 BASE) MCG/ACT IN AERS Inhalation Inhale 2 puffs into the lungs every 4 (four) hours as needed for wheezing. 6.7 g 0  . PREDNISONE 20 MG PO TABS Oral Take 3 tablets (60 mg total) by  mouth daily. 15 tablet 0    BP 116/71  Pulse 96  Temp(Src) 98.8 F (37.1 C) (Oral)  Resp 18  SpO2 98%  Physical Exam  Nursing note and vitals reviewed. Constitutional: She appears well-developed and well-nourished. No distress.  HENT:  Head: Normocephalic and atraumatic.  Right Ear: External ear normal.  Left Ear: External ear normal.  Nose: Nose normal.  Mouth/Throat: Oropharynx is clear and moist. No oropharyngeal exudate.  Neck: Normal range of motion. Neck supple. No JVD present. No tracheal deviation present.  Cardiovascular: Normal rate, regular rhythm, normal heart sounds and intact distal pulses.  Exam reveals no gallop and no friction rub.   No murmur heard. Pulmonary/Chest: Effort normal. No stridor. No respiratory distress. She has wheezes. She has no rales. She exhibits no tenderness.  Abdominal: She exhibits no distension and no mass. There is no tenderness. There is no rebound and no guarding.  Musculoskeletal: Normal range of motion. She exhibits no edema and no tenderness.  Lymphadenopathy:    She has no cervical adenopathy.  Skin: Skin is warm. No rash noted. She is not diaphoretic. No erythema. No pallor.  Psychiatric: She has a normal mood and affect. Her behavior is normal. Judgment and thought content normal.    ED Course  Procedures (including critical care  time)  Labs Reviewed - No data to display No results found.   1. Asthma exacerbation       MDM   31 year old female with simple asthma exacerbation will give albuterol Atrovent treatment, albuterol inhaler and steroids and expect DC homeon.        Olivia Mackie, MD 08/18/11 [phone]

## 2011-09-13 ENCOUNTER — Emergency Department (HOSPITAL_COMMUNITY)
Admission: EM | Admit: 2011-09-13 | Discharge: 2011-09-13 | Disposition: A | Discharge status: Home / Self Care | Payer: Self-pay | Attending: Emergency Medicine | Admitting: Emergency Medicine

## 2011-09-13 ENCOUNTER — Encounter (HOSPITAL_COMMUNITY): Payer: Self-pay | Admitting: *Deleted

## 2011-09-13 DIAGNOSIS — K219 Gastro-esophageal reflux disease without esophagitis: Secondary | ICD-10-CM | POA: Insufficient documentation

## 2011-09-13 DIAGNOSIS — J45901 Unspecified asthma with (acute) exacerbation: Secondary | ICD-10-CM | POA: Insufficient documentation

## 2011-09-13 MED ORDER — ALBUTEROL SULFATE HFA 108 (90 BASE) MCG/ACT IN AERS: 1.0000 | INHALATION_SPRAY | Freq: Four times a day (QID) | RESPIRATORY_TRACT | Status: DC | PRN

## 2011-09-13 MED ORDER — ALBUTEROL SULFATE (5 MG/ML) 0.5% IN NEBU
5.0000 mg | INHALATION_SOLUTION | Freq: Once | RESPIRATORY_TRACT | Status: AC
  Administered 2011-09-13: 5 mg via RESPIRATORY_TRACT
  Filled 2011-09-13: qty 1

## 2011-09-13 MED ORDER — IPRATROPIUM BROMIDE 0.02 % IN SOLN
0.5000 mg | Freq: Once | RESPIRATORY_TRACT | Status: AC
  Administered 2011-09-13: 0.5 mg via RESPIRATORY_TRACT
  Filled 2011-09-13: qty 2.5

## 2011-09-13 MED ORDER — ALBUTEROL SULFATE HFA 108 (90 BASE) MCG/ACT IN AERS
2.0000 | INHALATION_SPRAY | RESPIRATORY_TRACT | Status: DC | PRN
  Administered 2011-09-13: 2 via RESPIRATORY_TRACT
  Filled 2011-09-13: qty 6.7

## 2011-09-13 NOTE — ED Provider Notes (Signed)
History     CSN: [id number]  Arrival date & time 09/13/11  0310   First MD Initiated Contact with Patient 09/13/11 [phone]      Chief Complaint  Patient presents with  . Shortness of Breath    (Consider location/radiation/quality/duration/timing/severity/associated sxs/prior treatment) Patient is a 31 y.o. female presenting with shortness of breath. The history is provided by the patient.  Shortness of Breath  Associated symptoms include shortness of breath.   Patient presents to ER complaining of few day hx of gradual onset of asthma exacerbation. Patient states she has hx of asthma that is worsened by seasonal allergies. Patient states that she is supposed to have an albuterol inhaler but was unable to get it filled due to cost. patient states her asthma is associated with cough but denies fevers, chills, CP, difficulty breathing, lower extremity pain or swelling or hemoptysis. She states cough and SOB is similar to past asthma exacerbation. States symptoms are aggravated by pollen and smoke exposure. Symptoms were gradual onset, persistent and worsening. [facility] but has not seen them in "a long time."   Past Medical History  Diagnosis Date  . Shortness of breath   . GERD (gastroesophageal reflux disease)   . Umbilical hernia   . Asthma     Past Surgical History  Procedure Date  . No past surgeries     Family History  Problem Relation Age of Onset  . Diabetes Maternal Aunt   . Heart disease Maternal Aunt   . Hypertension Maternal Aunt   . Cancer Paternal Aunt   . Arthritis Paternal Grandmother   . Diabetes Paternal Grandmother     History  Substance Use Topics  . Smoking status: Never Smoker   . Smokeless tobacco: Never Used  . Alcohol Use: Yes     wine occaisionally    OB History    Grav Para Term Preterm Abortions TAB SAB Ect Mult Living   0               Review of Systems  Respiratory: Positive for shortness of breath.   All other  systems reviewed and are negative.    Allergies  Other  Home Medications   Current Outpatient Rx  Name Route Sig Dispense Refill  . ALBUTEROL SULFATE HFA 108 (90 BASE) MCG/ACT IN AERS Inhalation Inhale 2 puffs into the lungs every 4 (four) hours as needed for wheezing. 6.7 g 0  . ALBUTEROL SULFATE HFA 108 (90 BASE) MCG/ACT IN AERS Inhalation Inhale 1-2 puffs into the lungs every 6 (six) hours as needed for wheezing. 1 Inhaler 0  . VITAMIN D 1000 UNITS PO TABS Oral Take 1,000 Units by mouth daily.      BP 136/89  Pulse 88  Temp(Src) 98.5 F (36.9 C) (Oral)  Resp 18  SpO2 98%  Physical Exam  Vitals reviewed. Constitutional: She is oriented to person, place, and time. She appears well-developed and well-nourished. No distress.  HENT:  Head: Normocephalic and atraumatic.  Right Ear: External ear normal.  Left Ear: External ear normal.  Nose: Nose normal.  Mouth/Throat: Oropharynx is clear and moist. No oropharyngeal exudate.  Eyes: Conjunctivae are normal.  Neck: Normal range of motion. Neck supple.  Cardiovascular: Normal rate, regular rhythm and normal heart sounds.  Exam reveals no gallop and no friction rub.   No murmur heard. Pulmonary/Chest: Effort normal and breath sounds normal. No respiratory distress. She has no wheezes. She has no rales. She exhibits  no tenderness.  Abdominal: Soft. Bowel sounds are normal. She exhibits no distension and no mass. There is no tenderness. There is no rebound and no guarding.  Musculoskeletal: She exhibits no edema and no tenderness.  Lymphadenopathy:    She has no cervical adenopathy.  Neurological: She is alert and oriented to person, place, and time. She has normal reflexes.  Skin: Skin is warm and dry. No rash noted. She is not diaphoretic.  Psychiatric: She has a normal mood and affect.    ED Course  Procedures (including critical care time)  Breathing neb treatment in ER with HFA inhaler given at bedside  Labs Reviewed -  No data to display No results found.   1. Asthma exacerbation       MDM  Afebrile, non toxic appearing stating asthma exacerbation with no difficulty breathing. Improvement of symptoms after neb treatment. Patient is agreeable to close follow up with [facility] at [facility] for further evaluation and management of asthma. Normal lung exam with pulse ox 98% on room air. Low risk PE and PERC negative.         [place], [place] 09/13/11 [phone]

## 2011-09-13 NOTE — ED Notes (Signed)
Pt refused to get into gown. 

## 2011-09-13 NOTE — ED Notes (Signed)
Pt with hx of asthma states she feels she is reacting to a allergen and has been having an increasingly difficult time breathing.  Lung sounds diminished bil.

## 2011-09-13 NOTE — ED Notes (Signed)
Pt c/o sob for the past 2-3 days.  He has a history of asthma.  Recent cough

## 2011-09-13 NOTE — Discharge Instructions (Signed)
Use albuterol inhaler as directed, as needed for asthma but follow up with [facility] is VERY important for ongoing management of your asthma. Return to ER at any time for changing or worsening of symptoms.   Asthma Attack Prevention HOW CAN ASTHMA BE PREVENTED? Currently, there is no way to prevent asthma from starting. However, you can take steps to control the disease and prevent its symptoms after you have been diagnosed. Learn about your asthma and how to control it. Take an active role to control your asthma by working with your caregiver to create and follow an asthma action plan. An asthma action plan guides you in taking your medicines properly, avoiding factors that make your asthma worse, tracking your level of asthma control, responding to worsening asthma, and seeking emergency care when needed. To track your asthma, keep records of your symptoms, check your peak flow number using a peak flow meter (handheld device that shows how well air moves out of your lungs), and get regular asthma checkups.  Other ways to prevent asthma attacks include:  Use medicines as your caregiver directs.   Identify and avoid things that make your asthma worse (as much as you can).   Keep track of your asthma symptoms and level of control.   Get regular checkups for your asthma.   With your caregiver, write a detailed plan for taking medicines and managing an asthma attack. Then be sure to follow your action plan. Asthma is an ongoing condition that needs regular monitoring and treatment.   Identify and avoid asthma triggers. A number of outdoor allergens and irritants (pollen, mold, cold air, air pollution) can trigger asthma attacks. Find out what causes or makes your asthma worse, and take steps to avoid those triggers (see below).   Monitor your breathing. Learn to recognize warning signs of an attack, such as slight coughing, wheezing or shortness of breath. However, your lung function may already  decrease before you notice any signs or symptoms, so regularly measure and record your peak airflow with a home peak flow meter.   Identify and treat attacks early. If you act quickly, you're less likely to have a severe attack. You will also need less medicine to control your symptoms. When your peak flow measurements decrease and alert you to an upcoming attack, take your medicine as instructed, and immediately stop any activity that may have triggered the attack. If your symptoms do not improve, get medical help.   Pay attention to increasing quick-relief inhaler use. If you find yourself relying on your quick-relief inhaler (such as albuterol), your asthma is not under control. See your caregiver about adjusting your treatment.  IDENTIFY AND CONTROL FACTORS THAT MAKE YOUR ASTHMA WORSE A number of common things can set off or make your asthma symptoms worse (asthma triggers). Keep track of your asthma symptoms for several weeks, detailing all the environmental and emotional factors that are linked with your asthma. When you have an asthma attack, go back to your asthma diary to see which factor, or combination of factors, might have contributed to it. Once you know what these factors are, you can take steps to control many of them.  Allergies: If you have allergies and asthma, it is important to take asthma prevention steps at home. Asthma attacks (worsening of asthma symptoms) can be triggered by allergies, which can cause temporary increased inflammation of your airways. Minimizing contact with the substance to which you are allergic will help prevent an asthma attack. Animal Dander:  Some people are allergic to the flakes of skin or dried saliva from animals with fur or feathers. Keep these pets out of your home.   If you can't keep a pet outdoors, keep the pet out of your bedroom and other sleeping areas at all times, and keep the door closed.   Remove carpets and furniture covered with cloth  from your home. If that is not possible, keep the pet away from fabric-covered furniture and carpets.  Dust Mites:  Many people with asthma are allergic to dust mites. Dust mites are tiny bugs that are found in every home, in mattresses, pillows, carpets, fabric-covered furniture, bedcovers, clothes, stuffed toys, fabric, and other fabric-covered items.   Cover your mattress in a special dust-proof cover.   Cover your pillow in a special dust-proof cover, or wash the pillow each week in hot water. Water must be hotter than 130 F to kill dust mites. Cold or warm water used with detergent and bleach can also be effective.   Wash the sheets and blankets on your bed each week in hot water.   Try not to sleep or lie on cloth-covered cushions.   Call ahead when traveling and ask for a smoke-free hotel room. Bring your own bedding and pillows, in case the hotel only supplies feather pillows and down comforters, which may contain dust mites and cause asthma symptoms.   Remove carpets from your bedroom and those laid on concrete, if you can.   Keep stuffed toys out of the bed, or wash the toys weekly in hot water or cooler water with detergent and bleach.  Cockroaches:  Many people with asthma are allergic to the droppings and remains of cockroaches.   Keep food and garbage in closed containers. Never leave food out.   Use poison baits, traps, powders, gels, or paste (for example, boric acid).   If a spray is used to kill cockroaches, stay out of the room until the odor goes away.  Indoor Mold:  Fix leaky faucets, pipes, or other sources of water that have mold around them.   Clean moldy surfaces with a cleaner that has bleach in it.  Pollen and Outdoor Mold:  When pollen or mold spore counts are high, try to keep your windows closed.   Stay indoors with windows closed from late morning to afternoon, if you can. Pollen and some mold spore counts are highest at that time.   Ask your  caregiver whether you need to take or increase anti-inflammatory medicine before your allergy season starts.  Irritants:   Tobacco smoke is an irritant. If you smoke, ask your caregiver how you can quit. Ask family members to quit smoking, too. Do not allow smoking in your home or car.   If possible, do not use a wood-burning stove, kerosene heater, or fireplace. Minimize exposure to all sources of smoke, including incense, candles, fires, and fireworks.   Try to stay away from strong odors and sprays, such as perfume, talcum powder, hair spray, and paints.   Decrease humidity in your home and use an indoor air cleaning device. Reduce indoor humidity to below 60 percent. Dehumidifiers or central air conditioners can do this.   Try to have someone else vacuum for you once or twice a week, if you can. Stay out of rooms while they are being vacuumed and for a short while afterward.   If you vacuum, use a dust mask from a hardware store, a double-layered or microfilter vacuum cleaner bag,  or a vacuum cleaner with a HEPA filter.   Sulfites in foods and beverages can be irritants. Do not drink beer or wine, or eat dried fruit, processed potatoes, or shrimp if they cause asthma symptoms.   Cold air can trigger an asthma attack. Cover your nose and mouth with a scarf on cold or windy days.   Several health conditions can make asthma more difficult to manage, including runny nose, sinus infections, reflux disease, psychological stress, and sleep apnea. Your caregiver will treat these conditions, as well.   Avoid close contact with people who have a cold or the flu, since your asthma symptoms may get worse if you catch the infection from them. Wash your hands thoroughly after touching items that may have been handled by people with a respiratory infection.   Get a flu shot every year to protect against the flu virus, which often makes asthma worse for days or weeks. Also get a pneumonia shot once every  five to 10 years.  Drugs:  Aspirin and other painkillers can cause asthma attacks. 10% to 20% of people with asthma have sensitivity to aspirin or a group of painkillers called non-steroidal anti-inflammatory drugs (NSAIDS), such as ibuprofen and naproxen. These drugs are used to treat pain and reduce fevers. Asthma attacks caused by any of these medicines can be severe and even fatal. These drugs must be avoided in people who have known aspirin sensitive asthma. Products with acetaminophen are considered safe for people who have asthma. It is important that people with aspirin sensitivity read labels of all over-the-counter drugs used to treat pain, colds, coughs, and fever.   Beta blockers and ACE inhibitors are other drugs which you should discuss with your caregiver, in relation to your asthma.  ALLERGY SKIN TESTING  Ask your asthma caregiver about allergy skin testing or blood testing (RAST test) to identify the allergens to which you are sensitive. If you are found to have allergies, allergy shots (immunotherapy) for asthma may help prevent future allergies and asthma. With allergy shots, small doses of allergens (substances to which you are allergic) are injected under your skin on a regular schedule. Over a period of time, your body may become used to the allergen and less responsive with asthma symptoms. You can also take measures to minimize your exposure to those allergens. EXERCISE  If you have exercise-induced asthma, or are planning vigorous exercise, or exercise in cold, humid, or dry environments, prevent exercise-induced asthma by following your caregiver's advice regarding asthma treatment before exercising. Document Released: 03/19/2009 Document Revised: 03/20/2011 Document Reviewed: 03/19/2009 [facility] Patient Information 2012 [place], [place].

## 2011-09-13 NOTE — ED Provider Notes (Signed)
Medical screening examination/treatment/procedure(s) were performed by non-physician practitioner and as supervising physician I was immediately available for consultation/collaboration.   Lyanne Co, MD 09/13/11 [phone]

## 2011-10-10 ENCOUNTER — Emergency Department (HOSPITAL_COMMUNITY)
Admission: EM | Admit: 2011-10-10 | Discharge: 2011-10-10 | Disposition: A | Discharge status: Home / Self Care | Payer: Self-pay | Attending: Emergency Medicine | Admitting: Emergency Medicine

## 2011-10-10 ENCOUNTER — Encounter (HOSPITAL_COMMUNITY): Payer: Self-pay | Admitting: *Deleted

## 2011-10-10 ENCOUNTER — Emergency Department (HOSPITAL_COMMUNITY)
Admission: EM | Admit: 2011-10-10 | Discharge: 2011-10-10 | Discharge status: Against Medical Advice | Payer: Self-pay | Attending: Emergency Medicine | Admitting: Emergency Medicine

## 2011-10-10 DIAGNOSIS — J45901 Unspecified asthma with (acute) exacerbation: Secondary | ICD-10-CM | POA: Insufficient documentation

## 2011-10-10 DIAGNOSIS — Z0389 Encounter for observation for other suspected diseases and conditions ruled out: Secondary | ICD-10-CM | POA: Insufficient documentation

## 2011-10-10 DIAGNOSIS — Z79899 Other long term (current) drug therapy: Secondary | ICD-10-CM | POA: Insufficient documentation

## 2011-10-10 MED ORDER — ALBUTEROL SULFATE HFA 108 (90 BASE) MCG/ACT IN AERS
2.0000 | INHALATION_SPRAY | Freq: Once | RESPIRATORY_TRACT | Status: AC
  Administered 2011-10-10: 2 via RESPIRATORY_TRACT
  Filled 2011-10-10: qty 6.7

## 2011-10-10 MED ORDER — ALBUTEROL SULFATE (5 MG/ML) 0.5% IN NEBU
INHALATION_SOLUTION | RESPIRATORY_TRACT | Status: AC
  Administered 2011-10-10: 5 mg via RESPIRATORY_TRACT
  Filled 2011-10-10: qty 1

## 2011-10-10 MED ORDER — ALBUTEROL SULFATE (5 MG/ML) 0.5% IN NEBU
5.0000 mg | INHALATION_SOLUTION | Freq: Once | RESPIRATORY_TRACT | Status: AC
  Administered 2011-10-10: 5 mg via RESPIRATORY_TRACT

## 2011-10-10 MED ORDER — ALBUTEROL SULFATE HFA 108 (90 BASE) MCG/ACT IN AERS: 2.0000 | INHALATION_SPRAY | RESPIRATORY_TRACT | Status: AC | PRN

## 2011-10-10 NOTE — Discharge Instructions (Signed)
Asthma, Adult Asthma is caused by narrowing of the air passages in the lungs. It may be triggered by pollen, dust, animal dander, molds, some foods, respiratory infections, exposure to smoke, exercise, emotional stress or other allergens (things that cause allergic reactions or allergies). Repeat attacks are common. HOME CARE INSTRUCTIONS   Use prescription medications as ordered by your caregiver.   Avoid pollen, dust, animal dander, molds, smoke and other things that cause attacks at home and at work.   You may have fewer attacks if you decrease dust in your home. Electrostatic air cleaners may help.   It may help to replace your pillows or mattress with materials less likely to cause allergies.   Talk to your caregiver about an action plan for managing asthma attacks at home, including, the use of a peak flow meter which measures the severity of your asthma attack. An action plan can help minimize or stop the attack without having to seek medical care.   If you are not on a fluid restriction, drink 8 to 10 glasses of water each day.   Always have a plan prepared for seeking medical attention, including, calling your physician, accessing local emergency care, and calling 911 (in the [place] for a severe attack.   Discuss possible exercise routines with your caregiver.   If animal dander is the cause of asthma, you may need to get rid of pets.  SEEK MEDICAL CARE IF:   You have wheezing and shortness of breath even if taking medicine to prevent attacks.   You have muscle aches, chest pain or thickening of sputum.   Your sputum changes from clear or white to yellow, green, gray, or bloody.   You have any problems that may be related to the medicine you are taking (such as a rash, itching, swelling or trouble breathing).  SEEK IMMEDIATE MEDICAL CARE IF:   Your usual medicines do not stop your wheezing or there is increased coughing and/or shortness of breath.   You have increased  difficulty breathing.   You have a fever.  MAKE SURE YOU:   Understand these instructions.   Will watch your condition.   Will get help right away if you are not doing well or get worse.  Document Released: 03/31/2005 Document Revised: 03/20/2011 Document Reviewed: 11/17/2007 [facility] Patient Information 2012 [place], [place].Asthma, F.L.A.R.E. Most people with asthma do not get so sick that they need emergency care. The fact that you had to get emergency care may mean:  You are not taking your long-term control medicine the right way.   You have not been prescribed any or enough long-term control medicine.   You are still exposed to triggers that start your asthma symptoms.  You can avoid asthma flare-ups by using this F.L.A.R.E. plan until you see your primary doctor. F FOLLOW UP WITH YOUR PRIMARY DOCTOR - CALL TO MAKE AN APPOINTMENT TO BE SEEN.  If you have trouble making an appointment, ask to speak to the office nurse.   If you do not have a primary care doctor, call to get one.  At the follow-up appointment:  Bring all of your medications and this plan with you.   Make an asthma action plan with your doctor that you can follow every day to keep your asthma under control.   Write down your questions and your doctor's answers.  This will make your emergency visits rare. L LEARN ABOUT YOUR ASTHMA MEDICINES. TAKE ALL OF THESE MEDICINES JUST AS THE DOCTOR TELLS YOU,  EVEN IF YOU ARE FEELING MUCH BETTER. Quick-relief or Rescue  Kind of medicine   Name of medicine   How much   How often & how long you need to take it  Long-term control  Kind of medicine   Name of medicine   How much   How often & how long you need to take it  Steroid pills or syrup  Kind of medicine   Name of medicine   How much   How often & how long you need to take it  A ASTHMA IS A LIFE-LONG (CHRONIC) DISEASE. Even though your breathing is better after getting emergency care, you  still need to get long-term control of your asthma. If you do not, you are at risk for more severe flare-ups and even death.  If you use quick-relief medicine more than 2 times per week, your asthma is not under control. You need to see your doctor or an asthma specialist to make a plan to get control of your asthma.   Take long-term control medicine every day as ordered by your doctor.   Figure out what things make your asthma flare up and try to stay away from these "triggers."  R RESPOND TO THESE WARNING SIGNS THAT YOUR ASTHMA IS GETTING WORSE:  Your chest feels tight.   You are short of breath.   You are wheezing.   You are coughing.   Your peak flow is getting low.  Keep taking your medicines as prescribed and call your doctor. E EMERGENCY CARE MAY BE NEEDED IF YOU:  Have trouble talking.   Are working hard to breathe (may see skin sucking in at rib cage or above breast bone).   Need to use quick-relief medicine more often than every 4 hours.   See your peak flow dropping.  Take your quick-relief medicine and wait 20 minutes. If you do not feel better, take it again and wait 20 minutes. If you still do not feel better, take it again and call your doctor or 911 right away! LEARN ABOUT ASTHMA Asthma is a life-long disease that can make it hard for you to get air in and out of your lungs. Your asthma triggers make the air tubes in your lungs get smaller. These are the tubes that carry air in and out of your lungs. Here is what happens:  Small breathing tubes in your lungs swell and make extra mucus.   Muscles around the small breathing tubes get tight and make them smaller.   Smaller breathing tubes then get clogged with the extra mucus.   Swelling, muscle tightness, and mucus make it harder for you to breathe. You start to cough and wheeze and your chest might feel tight.  Not all asthma flare-ups are the same. Some are worse than others. In a severe asthma flare-up, the  breathing tubes get so small that air cannot get in and out of the lungs. People can die if their asthma flare-up is severe. ASTHMA MEDICINES  Quick-relief or Rescue medicine: should help for about 4 hours, relaxes the muscles around your breathing tubes so air can get in and out. If you need to take quick-relief medicine more than 2 times per week, your asthma is not under control, and you should ask your doctor about long-term control medicine.   Long-term control medicine: must be taken every day in order to work right. It keeps your breathing tubes from swelling, and can prevent most asthma flare-ups. This medicine cannot  stop a flare-up once it starts. During flare-ups, use quick-relief medicine right away and take your long-term control medicine as usual.   Steroid pills or syrup: can help the swelling in your breathing tubes go away. You must take this medicine just as the doctor tells you to. Do not skip a dose, and do not stop taking it unless a doctor tells you to stop.  TRIGGERS: TELL YOUR DOCTOR ABOUT THE THINGS THAT MAKE YOUR ASTHMA WORSE. What started or triggered your asthma flare-up this time? COMMON ASTHMA TRIGGERS:  Breathing in chemicals, dusts, or fumes at work.   Colds or flu.   Animals.   Dust.   Pollen and mold.   Strong odors.   Weather.   Exercise.   Cigarette and other smoke.   Medicines.  Smoking and secondhand smoke are asthma triggers. If you smoke, choose to quit. Never let others smoke near you or your children. Call your doctor or your health plan for help quitting. FOR MORE INFORMATION Ast[employer] of [place]: www.https://bond-evans.com/ American Lung Association: www.lungusa.org Asthma and Allergy Foundation of America: www.aafa.org This plan and asthma information are based on the NAEPP Guidelines for the Diagnosis and Management of Asthma, (ShinProtection.co.uk). Document Released: 11/06/2005 Document Revised:  03/20/2011 Document Reviewed: 01/15/2006 [facility] Patient Information 2012 [place], [place].

## 2011-10-10 NOTE — ED Provider Notes (Signed)
History     CSN: [id number]  Arrival date & time 10/10/11  0110   First MD Initiated Contact with Patient 10/10/11 0214      Chief Complaint  Patient presents with  . Shortness of Breath  . Wheezing  . Cough    (Consider location/radiation/quality/duration/timing/severity/associated sxs/prior treatment) HPI Comments: Patient here with 2 day history of shortness of breath after running out of her inhaler - she reports mild chest tightness without specific pain - denies fever, chills, reports dry and hacking cough without sputum production - denies nausea, vomiting, abdominal pain, dysuria.  Patient is a 31 y.o. female presenting with shortness of breath, wheezing, and cough. The history is provided by the patient. No language interpreter was used.  Shortness of Breath  The current episode started 2 days ago. The problem occurs frequently. The problem has been unchanged. The problem is moderate. The symptoms are relieved by humidity. Nothing aggravates the symptoms. Associated symptoms include orthopnea, cough, shortness of breath and wheezing. Pertinent negatives include no chest pain, no chest pressure, no fever, no rhinorrhea, no sore throat and no stridor. There was no intake of a foreign body. She has not inhaled smoke recently. She has had intermittent steroid use. She has had no prior hospitalizations. She has had no prior ICU admissions. She has had no prior intubations. Her past medical history is significant for asthma. She has been behaving normally. Urine output has been normal. The last void occurred less than 6 hours ago. There were no sick contacts. She has received no recent medical care.  Wheezing  Associated symptoms include orthopnea, cough, shortness of breath and wheezing. Pertinent negatives include no chest pain, no chest pressure, no fever, no rhinorrhea, no sore throat and no stridor. Her past medical history is significant for asthma.  Cough Associated symptoms  include shortness of breath and wheezing. Pertinent negatives include no chest pain, no chills, no headaches, no rhinorrhea and no sore throat. Her past medical history is significant for asthma.    Past Medical History  Diagnosis Date  . Shortness of breath   . GERD (gastroesophageal reflux disease)   . Umbilical hernia   . Asthma     Past Surgical History  Procedure Date  . No past surgeries     Family History  Problem Relation Age of Onset  . Diabetes Maternal Aunt   . Heart disease Maternal Aunt   . Hypertension Maternal Aunt   . Cancer Paternal Aunt   . Arthritis Paternal Grandmother   . Diabetes Paternal Grandmother     History  Substance Use Topics  . Smoking status: Never Smoker   . Smokeless tobacco: Never Used  . Alcohol Use: Yes     wine occaisionally    OB History    Grav Para Term Preterm Abortions TAB SAB Ect Mult Living   0               Review of Systems  Constitutional: Negative for fever and chills.  HENT: Negative for sore throat and rhinorrhea.   Eyes: Negative for pain.  Respiratory: Positive for cough, shortness of breath and wheezing. Negative for stridor.   Cardiovascular: Positive for orthopnea. Negative for chest pain.  Gastrointestinal: Negative for nausea, vomiting and abdominal pain.  Genitourinary: Negative for dysuria.  Musculoskeletal: Negative for back pain and arthralgias.  Skin: Negative for color change.  Neurological: Negative for headaches.  All other systems reviewed and are negative.    Allergies  Other  Home Medications   Current Outpatient Rx  Name Route Sig Dispense Refill  . ALBUTEROL SULFATE HFA 108 (90 BASE) MCG/ACT IN AERS Inhalation Inhale 2 puffs into the lungs every 4 (four) hours as needed for wheezing. 6.7 g 0    BP 128/94  Pulse 107  Temp 98.7 F (37.1 C) (Oral)  Resp 18  SpO2 97%  LMP 09/26/2011  Physical Exam  Nursing note and vitals reviewed. Constitutional: She is oriented to person,  place, and time. She appears well-developed and well-nourished. No distress.  HENT:  Head: Normocephalic and atraumatic.  Right Ear: External ear normal.  Left Ear: External ear normal.  Nose: Nose normal.  Mouth/Throat: Oropharynx is clear and moist. No oropharyngeal exudate.  Eyes: Conjunctivae are normal. Pupils are equal, round, and reactive to light. No scleral icterus.  Neck: Normal range of motion. Neck supple.  Cardiovascular: Regular rhythm and normal heart sounds.  Exam reveals no gallop and no friction rub.   No murmur heard.      tachycardia  Pulmonary/Chest: Effort normal and breath sounds normal. No respiratory distress. She has no wheezes. She has no rales. She exhibits no tenderness.  Abdominal: Soft. Bowel sounds are normal. She exhibits no distension. There is no tenderness.  Musculoskeletal: Normal range of motion. She exhibits no edema and no tenderness.  Lymphadenopathy:    She has no cervical adenopathy.  Neurological: She is alert and oriented to person, place, and time. No cranial nerve deficit. She exhibits normal muscle tone. Coordination normal.  Skin: Skin is warm and dry. No rash noted. No erythema. No pallor.  Psychiatric: She has a normal mood and affect. Her behavior is normal. Judgment and thought content normal.    ED Course  Procedures (including critical care time)  Labs Reviewed - No data to display No results found.  Asthma exacerbation   MDM  Patient with long history of asthma presents tonight with wheezing and shorntess of breath after running out of her inhaler - given nebulizer treatment in triage and reports marked improvement in symptoms since then.  Given inhaler here.        Izola Price [place], [place] 10/10/11 [phone]

## 2011-10-10 NOTE — ED Notes (Signed)
sp02 and HR checked at [facility], Sp02 at 97%, HR 116

## 2011-10-10 NOTE — ED Notes (Signed)
H/o asthma, ran out of albuterol, C/o sob, cough and wheezing.  Denies fever. Some congestion ("spitting up phlegm"). Alert, NAD, calm, interactive, skin W&D, some increased wob, persistant congested cough. Clearing throat.

## 2011-10-10 NOTE — ED Provider Notes (Signed)
Medical screening examination/treatment/procedure(s) were performed by non-physician practitioner and as supervising physician I was immediately available for consultation/collaboration.  Katya Rolston, MD 10/10/11 0738 

## 2011-10-10 NOTE — ED Notes (Signed)
Pt states she is leaving due to the wait.

## 2011-11-08 ENCOUNTER — Emergency Department (HOSPITAL_COMMUNITY)
Admission: EM | Admit: 2011-11-08 | Discharge: 2011-11-08 | Disposition: A | Discharge status: Home / Self Care | Payer: Self-pay | Attending: Emergency Medicine | Admitting: Emergency Medicine

## 2011-11-08 ENCOUNTER — Encounter (HOSPITAL_COMMUNITY): Payer: Self-pay | Admitting: Physical Medicine and Rehabilitation

## 2011-11-08 DIAGNOSIS — J45909 Unspecified asthma, uncomplicated: Secondary | ICD-10-CM | POA: Insufficient documentation

## 2011-11-08 DIAGNOSIS — K219 Gastro-esophageal reflux disease without esophagitis: Secondary | ICD-10-CM | POA: Insufficient documentation

## 2011-11-08 DIAGNOSIS — J45901 Unspecified asthma with (acute) exacerbation: Secondary | ICD-10-CM

## 2011-11-08 MED ORDER — PREDNISONE 10 MG PO TABS: 50.0000 mg | ORAL_TABLET | Freq: Every day | ORAL | Status: AC

## 2011-11-08 MED ORDER — ALBUTEROL SULFATE HFA 108 (90 BASE) MCG/ACT IN AERS
2.0000 | INHALATION_SPRAY | Freq: Four times a day (QID) | RESPIRATORY_TRACT | Status: DC
  Administered 2011-11-08: 2 via RESPIRATORY_TRACT
  Filled 2011-11-08: qty 6.7

## 2011-11-08 MED ORDER — PREDNISONE 20 MG PO TABS
60.0000 mg | ORAL_TABLET | Freq: Once | ORAL | Status: AC
  Administered 2011-11-08: 60 mg via ORAL
  Filled 2011-11-08: qty 3

## 2011-11-08 NOTE — ED Provider Notes (Signed)
History   This chart was scribed for Marilyn Munch, MD by Charolett Bumpers . The patient was seen in room TR06C/TR06C. Patient's care was started at 1638.   CSN: [id number]  Arrival date & time 11/08/11  1620   First MD Initiated Contact with Patient 11/08/11 1638      Chief Complaint  Patient presents with  . Asthma  . Cough    (Consider location/radiation/quality/duration/timing/severity/associated sxs/prior treatment) HPI Demetrius L Babiarz is a 31 y.o. female who presents to the Emergency Department complaining of intermittent, moderate productive cough with an onset of earlier today around 8 am. Pt reports that her cough produces clear mucous. Pt reports associated wheezing and light-headedness. Pt denies any fevers, chills, n/v, confusion or SOB. Pt states that she has a h/o asthma and states that she ran out of her albuterol inhaler x2 days ago. Pt states that she was last on steroids 2 months ago. Pt states that she doesn't currently have a PCP. Pt denies any h/o hospitalizations or intubation due to her asthma. Pt denies any other prior medical hx and states that she is otherwise normally healthy.   Past Medical History  Diagnosis Date  . Shortness of breath   . GERD (gastroesophageal reflux disease)   . Umbilical hernia   . Asthma     Past Surgical History  Procedure Date  . No past surgeries     Family History  Problem Relation Age of Onset  . Diabetes Maternal Aunt   . Heart disease Maternal Aunt   . Hypertension Maternal Aunt   . Cancer Paternal Aunt   . Arthritis Paternal Grandmother   . Diabetes Paternal Grandmother     History  Substance Use Topics  . Smoking status: Never Smoker   . Smokeless tobacco: Never Used  . Alcohol Use: Yes     wine occaisionally    OB History    Grav Para Term Preterm Abortions TAB SAB Ect Mult Living   0               Review of Systems  Constitutional:       Per HPI, otherwise negative  HENT:       Per HPI,  otherwise negative  Eyes: Negative.   Respiratory: Positive for cough and wheezing.        Per HPI, otherwise negative  Cardiovascular:       Per HPI, otherwise negative  Gastrointestinal: Negative for vomiting.  Genitourinary: Negative.   Musculoskeletal:       Per HPI, otherwise negative  Skin: Negative.   Neurological: Positive for light-headedness. Negative for syncope.    Allergies  Other  Home Medications   Current Outpatient Rx  Name Route Sig Dispense Refill  . ALBUTEROL SULFATE HFA 108 (90 BASE) MCG/ACT IN AERS Inhalation Inhale 2 puffs into the lungs every 4 (four) hours as needed for wheezing. 6.7 g 0  . ALBUTEROL SULFATE HFA 108 (90 BASE) MCG/ACT IN AERS Inhalation Inhale 2 puffs into the lungs every 4 (four) hours as needed for wheezing. 1 Inhaler 0    BP 110/81  Pulse 93  Temp 98.7 F (37.1 C) (Oral)  Resp 18  SpO2 99%  LMP 09/26/2011  Physical Exam  Nursing note and vitals reviewed. Constitutional: She is oriented to person, place, and time. She appears well-developed and well-nourished. No distress.  HENT:  Head: Normocephalic and atraumatic.  Eyes: Conjunctivae and EOM are normal.  Neck: Normal range of motion. Neck supple. No  tracheal deviation present.  Cardiovascular: Normal rate, regular rhythm and normal heart sounds.   Pulmonary/Chest: Effort normal. No stridor. No respiratory distress. She has wheezes.       Minimal wheezing.   Abdominal: She exhibits no distension.  Musculoskeletal: Normal range of motion. She exhibits no edema.  Neurological: She is alert and oriented to person, place, and time. No cranial nerve deficit.  Skin: Skin is warm and dry.  Psychiatric: She has a normal mood and affect. Her behavior is normal.    ED Course  Procedures (including critical care time)  DIAGNOSTIC STUDIES: Oxygen Saturation is 99% on room air, normal by my interpretation.    COORDINATION OF CARE:  16:47-Discussed planned course of treatment  with the patient, who is agreeable at this time.   17:00-Medication Orders: Prednisone (Deltasone) tablet 60 mg-once  20:00-Medication Orders: Albuterol (Proventil HFA; Ventolin HFA) 108 (90 BASE) mcg/act inhaler 2 puff-every 6 hrs.   Labs Reviewed - No data to display No results found.   No diagnosis found.    MDM  I personally performed the services described in this documentation, which was scribed in my presence. The recorded information has been reviewed and considered.  The patient presents with concerns of persistent wheezing and mild productive cough.  On exam the patient is in no distress.  She has mild wheezing bilaterally, but is not hypoxic or tachypneic.  Given the patient's history of asthma, her lack of current medication, she was provided a new inhaler.  She was also started on steroids.  Absent distress, abnormal vital signs, the patient was discharged in stable condition to followup at a primary care facility.  Marilyn Munch, MD 11/08/11 1704

## 2011-11-08 NOTE — ED Notes (Signed)
Pt presents to department for evaluation of asthma exacerbation. States recent cough and SOB. Ran out of inhaler x2 days ago. No wheezing noted upon arrival. Respirations unlabored. Skin warm and dry. He is alert and oriented x4. Denies pain. No signs of distress noted.

## 2012-01-01 ENCOUNTER — Encounter (HOSPITAL_COMMUNITY): Payer: Self-pay | Admitting: *Deleted

## 2012-01-01 ENCOUNTER — Emergency Department (HOSPITAL_COMMUNITY)
Admission: EM | Admit: 2012-01-01 | Discharge: 2012-01-01 | Disposition: A | Discharge status: Home / Self Care | Payer: Self-pay | Attending: Emergency Medicine | Admitting: Emergency Medicine

## 2012-01-01 DIAGNOSIS — T310 Burns involving less than 10% of body surface: Secondary | ICD-10-CM | POA: Insufficient documentation

## 2012-01-01 DIAGNOSIS — T300 Burn of unspecified body region, unspecified degree: Secondary | ICD-10-CM | POA: Diagnosis present

## 2012-01-01 DIAGNOSIS — K219 Gastro-esophageal reflux disease without esophagitis: Secondary | ICD-10-CM | POA: Insufficient documentation

## 2012-01-01 DIAGNOSIS — T22299A Burn of second degree of multiple sites of unspecified shoulder and upper limb, except wrist and hand, initial encounter: Secondary | ICD-10-CM | POA: Insufficient documentation

## 2012-01-01 DIAGNOSIS — T23269A Burn of second degree of back of unspecified hand, initial encounter: Secondary | ICD-10-CM | POA: Insufficient documentation

## 2012-01-01 DIAGNOSIS — T2020XA Burn of second degree of head, face, and neck, unspecified site, initial encounter: Secondary | ICD-10-CM | POA: Insufficient documentation

## 2012-01-01 DIAGNOSIS — T2027XA Burn of second degree of neck, initial encounter: Secondary | ICD-10-CM | POA: Insufficient documentation

## 2012-01-01 DIAGNOSIS — X038XXA Other exposure to controlled fire, not in building or structure, initial encounter: Secondary | ICD-10-CM | POA: Insufficient documentation

## 2012-01-01 DIAGNOSIS — J45909 Unspecified asthma, uncomplicated: Secondary | ICD-10-CM | POA: Insufficient documentation

## 2012-01-01 MED ORDER — SILVER SULFADIAZINE 1 % EX CREA
TOPICAL_CREAM | Freq: Once | CUTANEOUS | Status: AC
  Administered 2012-01-01: 19:00:00 via TOPICAL
  Filled 2012-01-01: qty 85

## 2012-01-01 MED ORDER — BACITRACIN ZINC 500 UNIT/GM EX OINT
TOPICAL_OINTMENT | CUTANEOUS | Status: DC
  Filled 2012-01-01: qty 15

## 2012-01-01 MED ORDER — SILVER SULFADIAZINE 1 % EX CREA: TOPICAL_CREAM | Freq: Every day | CUTANEOUS | Status: AC

## 2012-01-01 MED ORDER — TETANUS-DIPHTH-ACELL PERTUSSIS 5-2.5-18.5 LF-MCG/0.5 IM SUSP
0.5000 mL | Freq: Once | INTRAMUSCULAR | Status: AC
  Administered 2012-01-01: 0.5 mL via INTRAMUSCULAR
  Filled 2012-01-01: qty 0.5

## 2012-01-01 MED ORDER — BACITRACIN 500 UNIT/GM EX OINT
1.0000 "application " | TOPICAL_OINTMENT | CUTANEOUS | Status: AC
  Administered 2012-01-01: 1 via TOPICAL
  Filled 2012-01-01: qty 0.9

## 2012-01-01 MED ORDER — BACITRACIN ZINC 500 UNIT/GM EX OINT: TOPICAL_OINTMENT | Freq: Two times a day (BID) | CUTANEOUS | Status: AC

## 2012-01-01 MED ORDER — OXYCODONE-ACETAMINOPHEN 5-325 MG PO TABS: 1.0000 | ORAL_TABLET | Freq: Four times a day (QID) | ORAL | Status: AC | PRN

## 2012-01-01 NOTE — ED Notes (Signed)
Pt. Stable, no respiratory distress. Pt. Verbalized understanding of application of ointments prescribed.
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
## 2012-01-02 NOTE — ED Provider Notes (Signed)
I have personally seen and examined the patient.  I have discussed the plan of care with the resident.  I have reviewed the documentation on PMH/FH/Soc. History.  I have reviewed the documentation of the resident and agree.  Pt well appearing, no distress, no signs of oral/airway involvement   Joya Gaskins, MD 01/02/12 [phone]

## 2013-12-28 ENCOUNTER — Emergency Department (INDEPENDENT_AMBULATORY_CARE_PROVIDER_SITE_OTHER)
Admission: EM | Admit: 2013-12-28 | Discharge: 2013-12-28 | Disposition: A | Discharge status: Home / Self Care | Payer: Self-pay | Source: Home / Self Care | Attending: Family Medicine | Admitting: Family Medicine

## 2013-12-28 ENCOUNTER — Encounter (HOSPITAL_COMMUNITY): Payer: Self-pay | Admitting: Emergency Medicine

## 2013-12-28 DIAGNOSIS — Z9109 Other allergy status, other than to drugs and biological substances: Secondary | ICD-10-CM

## 2013-12-28 DIAGNOSIS — M9420 Chondromalacia, unspecified site: Secondary | ICD-10-CM

## 2013-12-28 DIAGNOSIS — N644 Mastodynia: Secondary | ICD-10-CM

## 2013-12-28 LAB — POCT URINALYSIS DIP (DEVICE)
BILIRUBIN URINE: NEGATIVE
Glucose, UA: NEGATIVE mg/dL
Ketones, ur: NEGATIVE mg/dL
LEUKOCYTES UA: NEGATIVE
NITRITE: NEGATIVE
PH: 6 (ref 5.0–8.0)
PROTEIN: NEGATIVE mg/dL
Specific Gravity, Urine: 1.015 (ref 1.005–1.030)
Urobilinogen, UA: 0.2 mg/dL (ref 0.0–1.0)

## 2013-12-28 LAB — POCT PREGNANCY, URINE: Preg Test, Ur: NEGATIVE

## 2013-12-28 MED ORDER — FLUTICASONE PROPIONATE 50 MCG/ACT NA SUSP: 1.0000 | Freq: Every day | NASAL | Status: AC

## 2013-12-28 MED ORDER — DICLOFENAC SODIUM 75 MG PO TBEC: 75.0000 mg | DELAYED_RELEASE_TABLET | Freq: Two times a day (BID) | ORAL | Status: AC

## 2013-12-28 MED ORDER — BUDESONIDE-FORMOTEROL FUMARATE 160-4.5 MCG/ACT IN AERO: 2.0000 | INHALATION_SPRAY | Freq: Every day | RESPIRATORY_TRACT | Status: AC

## 2013-12-28 NOTE — Discharge Instructions (Signed)
Your condition is likely related to costochondritis or inflammation of the cartilage of the chest wall. There is no sign of deep lung infection or cancer Please start the voltaren twice a day for the next 5 days then as needed.  Make sure to stretch and do gentle massage as needed Start using to flonase every night for allergies/stuffy nose, and zyrtec or another over the counter allergy medication Please follow up with an OBGYN at [facility] hospital for your breast pain if it continues

## 2013-12-28 NOTE — ED Provider Notes (Signed)
CSN: [id number]     Arrival date & time 12/28/13  0940 History   First MD Initiated Contact with Patient 12/28/13 0957     Chief Complaint  Patient presents with  . Back Pain   (Consider location/radiation/quality/duration/timing/severity/associated sxs/prior Treatment) HPI Side pain: R chest wall. Worse w/ deep breathing. Lasted for 2.5 hrs. Unsure of what made it go away. Pain radiated from R to [facility]. First occurrence 2 wks ago. Happening now about every 2-3 days. Deneis any recent changes in exercise routine, physical exertion. Sexually active and LMP last week. Denies any CP, HA, fevers, nausea, vomiting, trauma, dysuria, frequency. Mild intermittent lower abdominal discomfort. Diarrhea last week. \ Seen in [place] for similar symptoms a couple days ago and had US done which was nml per pt.    Past Medical History  Diagnosis Date  . Shortness of breath   . GERD (gastroesophageal reflux disease)   . Umbilical hernia   . Asthma    Past Surgical History  Procedure Laterality Date  . No past surgeries     Family History  Problem Relation Age of Onset  . Diabetes Maternal Aunt   . Heart disease Maternal Aunt   . Hypertension Maternal Aunt   . Cancer Paternal Aunt   . Arthritis Paternal Grandmother   . Diabetes Paternal Grandmother    History  Substance Use Topics  . Smoking status: Never Smoker   . Smokeless tobacco: Never Used  . Alcohol Use: Yes     Comment: wine occaisionally   OB History   Grav Para Term Preterm Abortions TAB SAB Ect Mult Living   0              Review of Systems Per HPI with all other pertinent systems negative.   Allergies  Other  Home Medications   Prior to Admission medications   Medication Sig Start Date End Date Taking? Authorizing Provider  albuterol (PROVENTIL HFA;VENTOLIN HFA) 108 (90 BASE) MCG/ACT inhaler Inhale 2 puffs into the lungs every 4 (four) hours as needed for wheezing. 08/18/11 08/17/12  Kalman Drape, MD  albuterol  (PROVENTIL HFA;VENTOLIN HFA) 108 (90 BASE) MCG/ACT inhaler Inhale 2 puffs into the lungs every 4 (four) hours as needed for wheezing. 10/10/11 10/09/12  Idalia Needle. Sanford, PA-C  bacitracin ointment Apply topically 2 (two) times daily. Apply to face twice daily. 01/01/12   Pamella Pert, MD  diclofenac (VOLTAREN) 75 MG EC tablet Take 1 tablet (75 mg total) by mouth 2 (two) times daily. 12/28/13   Waldemar Dickens, MD  fluticasone (FLONASE) 50 MCG/ACT nasal spray Place 1-2 sprays into both nostrils daily. 12/28/13   Waldemar Dickens, MD  oxyCODONE-acetaminophen (PERCOCET) 5-325 MG per tablet Take 1 tablet by mouth every 6 (six) hours as needed for pain. 01/01/12   Pamella Pert, MD  silver sulfADIAZINE (SILVADENE) 1 % cream Apply topically daily. Apply to arm, do not apply to face. 01/01/12   Pamella Pert, MD   BP 111/77  Pulse 90  Temp(Src) 98 F (36.7 C) (Oral)  Resp 14  SpO2 99%  LMP 12/21/2013 Physical Exam  Constitutional: She is oriented to person, place, and time. She appears well-developed and well-nourished. No distress.  HENT:  Head: Normocephalic.  Nasal speech   Neck: Normal range of motion. Neck supple.  Cardiovascular: Normal rate, normal heart sounds and intact distal pulses.  Exam reveals no gallop.   No murmur heard. Pulmonary/Chest: Effort normal and breath sounds normal. No respiratory  distress. She has no wheezes. She has no rales. She exhibits no tenderness.  Abdominal: Soft. She exhibits no distension. There is no tenderness.  Musculoskeletal:  Intermittent L breast tenderness w/o clear cystic or fibrous tissue ttp along the R inferior chondrosternal border.  Neurological: She is alert and oriented to person, place, and time. No cranial nerve deficit. Coordination normal.  Skin: Skin is warm. She is not diaphoretic.  Psychiatric: She has a normal mood and affect. Her behavior is normal. Judgment normal.    ED Course  Procedures (including critical care  time) Labs Review Labs Reviewed  POCT URINALYSIS DIP (DEVICE) - Abnormal; Notable for the following:    Hgb urine dipstick TRACE (*)    All other components within normal limits  POCT PREGNANCY, URINE    Imaging Review No results found.   MDM   1. Chondromalacia   2. Breast pain   3. Environmental allergies    Lots of reassurance as pt very concerned and w/ multiple vague complaints. Pt very satisfied w/ information and length of time given to educate.  - Start Voltaren Stretching F/u OBGYN for possible fibrocystic breast disease Start flonase and zyrtec F/u PRN Precautions given and all questions answered   Linna Darner, MD Family Medicine 12/28/2013, 11:00 AM      Waldemar Dickens, MD 12/28/13 1100

## 2013-12-28 NOTE — ED Notes (Signed)
Pt reports   Pain in  Both  Sides     Rib  Cage  Area         With  Symptoms    X  2  Weeks     -   Pt   Went  To  The   Er    In  [place]      But  walked  Out  Before  Treatment   Completed           Pt     Ambulated  To  Room  With a  Steady  Fluid  Gait          Speaking in  Complete  sentances       Skin is  Warm  And   Dry

## 2015-08-04 ENCOUNTER — Encounter (HOSPITAL_BASED_OUTPATIENT_CLINIC_OR_DEPARTMENT_OTHER): Payer: Self-pay | Admitting: Emergency Medicine

## 2015-08-04 ENCOUNTER — Emergency Department (HOSPITAL_BASED_OUTPATIENT_CLINIC_OR_DEPARTMENT_OTHER)
Admission: EM | Admit: 2015-08-04 | Discharge: 2015-08-04 | Disposition: A | Discharge status: Home / Self Care | Payer: Self-pay | Attending: Emergency Medicine | Admitting: Emergency Medicine

## 2015-08-04 ENCOUNTER — Emergency Department (HOSPITAL_BASED_OUTPATIENT_CLINIC_OR_DEPARTMENT_OTHER): Payer: Self-pay

## 2015-08-04 DIAGNOSIS — N920 Excessive and frequent menstruation with regular cycle: Secondary | ICD-10-CM | POA: Insufficient documentation

## 2015-08-04 DIAGNOSIS — D649 Anemia, unspecified: Secondary | ICD-10-CM | POA: Insufficient documentation

## 2015-08-04 DIAGNOSIS — J45909 Unspecified asthma, uncomplicated: Secondary | ICD-10-CM | POA: Insufficient documentation

## 2015-08-04 DIAGNOSIS — K429 Umbilical hernia without obstruction or gangrene: Secondary | ICD-10-CM | POA: Insufficient documentation

## 2015-08-04 HISTORY — DX: Anemia, unspecified: D64.9

## 2015-08-04 LAB — CBC WITH DIFFERENTIAL/PLATELET
BASOS ABS: 0 10*3/uL (ref 0.0–0.1)
Basophils Relative: 0 %
Eosinophils Absolute: 0.2 10*3/uL (ref 0.0–0.7)
Eosinophils Relative: 4 %
HCT: 25.3 % — ABNORMAL LOW (ref 36.0–46.0)
Hemoglobin: 7.9 g/dL — ABNORMAL LOW (ref 12.0–15.0)
LYMPHS ABS: 1.4 10*3/uL (ref 0.7–4.0)
Lymphocytes Relative: 35 %
MCH: 19.5 pg — ABNORMAL LOW (ref 26.0–34.0)
MCHC: 31.2 g/dL (ref 30.0–36.0)
MCV: 62.3 fL — ABNORMAL LOW (ref 78.0–100.0)
MONO ABS: 0.7 10*3/uL (ref 0.1–1.0)
Monocytes Relative: 16 %
NEUTROS PCT: 45 %
Neutro Abs: 1.8 10*3/uL (ref 1.7–7.7)
PLATELETS: 292 10*3/uL (ref 150–400)
RBC: 4.06 MIL/uL (ref 3.87–5.11)
RDW: 17.9 % — ABNORMAL HIGH (ref 11.5–15.5)
WBC: 4.1 10*3/uL (ref 4.0–10.5)

## 2015-08-04 LAB — BASIC METABOLIC PANEL
Anion gap: 8 (ref 5–15)
BUN: 10 mg/dL (ref 6–20)
CO2: 18 mmol/L — ABNORMAL LOW (ref 22–32)
CREATININE: 0.45 mg/dL (ref 0.44–1.00)
Calcium: 8.4 mg/dL — ABNORMAL LOW (ref 8.9–10.3)
Chloride: 109 mmol/L (ref 101–111)
Glucose, Bld: 91 mg/dL (ref 65–99)
POTASSIUM: 3.5 mmol/L (ref 3.5–5.1)
SODIUM: 135 mmol/L (ref 135–145)

## 2015-08-04 LAB — PREGNANCY, URINE: PREG TEST UR: NEGATIVE

## 2015-08-04 MED ORDER — TRAMADOL HCL 50 MG PO TABS: 50.0000 mg | ORAL_TABLET | Freq: Four times a day (QID) | ORAL | Status: AC | PRN

## 2015-08-04 MED ORDER — IOHEXOL 300 MG/ML  SOLN: 100.0000 mL | Freq: Once | INTRAMUSCULAR | Status: DC | PRN

## 2015-08-04 MED ORDER — IOPAMIDOL (ISOVUE-300) INJECTION 61%
100.0000 mL | Freq: Once | INTRAVENOUS | Status: AC | PRN
  Administered 2015-08-04: 100 mL via INTRAVENOUS

## 2015-08-04 NOTE — ED Notes (Signed)
Alert, NAD, calm, interactive, c/o periumbilical pain, TTP, relates to chronic intermitant hernia "for years", sudden reoccurrence of pain while standing at work today, (denies: back pain, urinary or vaginal sx, fever, nvd, constipation, bleeding or other sx). No meds PTA.

## 2015-08-04 NOTE — Discharge Instructions (Signed)
Anemia, Nonspecific Anemia is a condition in which the concentration of red blood cells or hemoglobin in the blood is below normal. Hemoglobin is a substance in red blood cells that carries oxygen to the tissues of the body. Anemia results in not enough oxygen reaching these tissues.  CAUSES  Common causes of anemia include:   Excessive bleeding. Bleeding may be internal or external. This includes excessive bleeding from periods (in women) or from the intestine.   Poor nutrition.   Chronic kidney, thyroid, and liver disease.  Bone marrow disorders that decrease red blood cell production.  Cancer and treatments for cancer.  HIV, AIDS, and their treatments.  Spleen problems that increase red blood cell destruction.  Blood disorders.  Excess destruction of red blood cells due to infection, medicines, and autoimmune disorders. SIGNS AND SYMPTOMS   Minor weakness.   Dizziness.   Headache.  Palpitations.   Shortness of breath, especially with exercise.   Paleness.  Cold sensitivity.  Indigestion.  Nausea.  Difficulty sleeping.  Difficulty concentrating. Symptoms may occur suddenly or they may develop slowly.  DIAGNOSIS  Additional blood tests are often needed. These help your health care provider determine the best treatment. Your health care provider will check your stool for blood and look for other causes of blood loss.  TREATMENT  Treatment varies depending on the cause of the anemia. Treatment can include:   Supplements of iron, vitamin 123456, or folic acid.   Hormone medicines.   A blood transfusion. This may be needed if blood loss is severe.   Hospitalization. This may be needed if there is significant continual blood loss.   Dietary changes.  Spleen removal. HOME CARE INSTRUCTIONS Keep all follow-up appointments. It often takes many weeks to correct anemia, and having your health care provider check on your condition and your response to  treatment is very important. SEEK IMMEDIATE MEDICAL CARE IF:   You develop extreme weakness, shortness of breath, or chest pain.   You become dizzy or have trouble concentrating.  You develop heavy vaginal bleeding.   You develop a rash.   You have bloody or black, tarry stools.   You faint.   You vomit up blood.   You vomit repeatedly.   You have abdominal pain.  You have a fever or persistent symptoms for more than 2-3 days.   You have a fever and your symptoms suddenly get worse.   You are dehydrated.  MAKE SURE YOU:  Understand these instructions.  Will watch your condition.  Will get help right away if you are not doing well or get worse.   This information is not intended to replace advice given to you by your health care provider. Make sure you discuss any questions you have with your health care provider.   Document Released: 05/08/2004 Document Revised: 12/01/2012 Document Reviewed: 09/24/2012 Elsevier Interactive Patient Education 2016 [employer]  Hernia A hernia happens when an organ or tissue inside your body pushes out through a weak spot in the belly (abdomen). HOME CARE  Avoid stretching or overusing (straining) the muscles near the hernia.  Do not lift anything heavier than 10 lb (4.5 kg).  Use the muscles in your leg when you lift something up. Do not use the muscles in your back.  When you cough, try to cough gently.  Eat a diet that has a lot of fiber. Eat lots of fruits and vegetables.  Drink enough fluids to keep your pee (urine) clear or pale yellow.  Try to drink 6-8 glasses of water a day.  Take medicines to make your poop soft (stool softeners) as told by your doctor.  Lose weight, if you are overweight.  Do not use any tobacco products, including cigarettes, chewing tobacco, or electronic cigarettes. If you need help quitting, ask your doctor.  Keep all follow-up visits as told by your doctor. This is important. GET  HELP IF:  The skin by the hernia gets puffy (swollen) or red.  The hernia is painful. GET HELP RIGHT AWAY IF:  You have a fever.  You have belly pain that is getting worse.  You feel sick to your stomach (nauseous) or you throw up (vomit).  You cannot push the hernia back in place by gently pressing on it while you are lying down.  The hernia:  Changes in shape or size.  Is stuck outside your belly.  Changes color.  Feels hard or tender.   This information is not intended to replace advice given to you by your health care provider. Make sure you discuss any questions you have with your health care provider.   Document Released: 09/18/2009 Document Revised: 04/21/2014 Document Reviewed: 02/08/2014 Elsevier Interactive Patient Education [employer].

## 2015-08-04 NOTE — ED Notes (Signed)
Pt reports abdominal hernia that is usually reducible, today while at work she felt a pop and immediate pain and is unable to push hernia back into place
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
## 2015-08-04 NOTE — ED Provider Notes (Signed)
CSN: [id number]     Arrival date & time 08/04/15  1849 History  By signing my name below, I, Rowan Blase, attest that this documentation has been prepared under the direction and in the presence of Leonard Schwartz, MD . Electronically Signed: Rowan Blase, Scribe. 08/04/2015. 7:53 PM.   Chief Complaint  Patient presents with  . Abdominal Pain   The history is provided by the patient. No language interpreter was used.   HPI Comments:  Marilyn Rodgers is a 35 y.o. female with PMHx of GERD and umbilical hernia who presents to the Emergency Department complaining of sudden onset periumbilical pain while standing at work earlier today. Pt has had a periumbilical hernia for the past 10 years that is usually reducible; while at work today she felt a pop and has been unable to reduce the hernia since. No alleviating factors noted or treatments attempted PTA.  Past Medical History  Diagnosis Date  . Shortness of breath   . GERD (gastroesophageal reflux disease)   . Umbilical hernia   . Asthma   . Anemia    Past Surgical History  Procedure Laterality Date  . No past surgeries     Family History  Problem Relation Age of Onset  . Diabetes Maternal Aunt   . Heart disease Maternal Aunt   . Hypertension Maternal Aunt   . Cancer Paternal Aunt   . Arthritis Paternal Grandmother   . Diabetes Paternal Grandmother    Social History  Substance Use Topics  . Smoking status: Never Smoker   . Smokeless tobacco: Never Used  . Alcohol Use: Yes     Comment: wine occaisionally   OB History    Gravida Para Term Preterm AB TAB SAB Ectopic Multiple Living   0              Review of Systems  Gastrointestinal: Negative for blood in stool.  Genitourinary: Positive for menstrual problem (heavy periods).  Hematological: Does not bruise/bleed easily.    10 systems reviewed and all are negative for acute change except as noted in the HPI.  Allergies  Other  Home Medications   Prior to  Admission medications   Medication Sig Start Date End Date Taking? Authorizing Provider  albuterol (PROVENTIL HFA;VENTOLIN HFA) 108 (90 BASE) MCG/ACT inhaler Inhale 2 puffs into the lungs every 4 (four) hours as needed for wheezing. 08/18/11 08/17/12  Linton Flemings, MD  albuterol (PROVENTIL HFA;VENTOLIN HFA) 108 (90 BASE) MCG/ACT inhaler Inhale 2 puffs into the lungs every 4 (four) hours as needed for wheezing. 10/10/11 10/09/12  Ignacia Felling, PA-C  bacitracin ointment Apply topically 2 (two) times daily. Apply to face twice daily. 01/01/12   Pamella Pert, MD  budesonide-formoterol (SYMBICORT) 160-4.5 MCG/ACT inhaler Inhale 2 puffs into the lungs daily. 12/28/13   Waldemar Dickens, MD  diclofenac (VOLTAREN) 75 MG EC tablet Take 1 tablet (75 mg total) by mouth 2 (two) times daily. 12/28/13   Waldemar Dickens, MD  fluticasone (FLONASE) 50 MCG/ACT nasal spray Place 1-2 sprays into both nostrils daily. 12/28/13   Waldemar Dickens, MD  oxyCODONE-acetaminophen (PERCOCET) 5-325 MG per tablet Take 1 tablet by mouth every 6 (six) hours as needed for pain. 01/01/12   Pamella Pert, MD  silver sulfADIAZINE (SILVADENE) 1 % cream Apply topically daily. Apply to arm, do not apply to face. 01/01/12   Pamella Pert, MD  traMADol (ULTRAM) 50 MG tablet Take 1 tablet (50 mg total) by mouth every 6 (six) hours as  needed. 08/04/15   Leonard Schwartz, MD   BP 108/76 mmHg  Pulse 75  Temp(Src) 98.3 F (36.8 C)  Resp 16  Ht 5\' 6"  (1.676 m)  Wt 142 lb (64.411 kg)  BMI 22.93 kg/m2  SpO2 100%  LMP 07/17/2015 (Approximate) Physical Exam  Constitutional: She is oriented to person, place, and time. She appears well-developed and well-nourished. No distress.  HENT:  Head: Normocephalic and atraumatic.  Eyes: Pupils are equal, round, and reactive to light.  Neck: Normal range of motion.  Cardiovascular: Normal rate and intact distal pulses.   Pulmonary/Chest: No respiratory distress.  Abdominal: Normal appearance. She exhibits  no distension. There is tenderness.    Musculoskeletal: Normal range of motion.  Neurological: She is alert and oriented to person, place, and time. No cranial nerve deficit.  Skin: Skin is warm and dry. No rash noted.  Psychiatric: She has a normal mood and affect. Her behavior is normal.  Nursing note and vitals reviewed.   ED Course  Procedures  DIAGNOSTIC STUDIES:  Oxygen Saturation is 100% on RA, normal by my interpretation.    COORDINATION OF CARE:  7:52 PM Will consult radiology. Discussed treatment plan with pt at bedside and pt agreed to plan.  8:15 PM Consulted radiologist regarding imaging options. Will order CT abdomen/pelvis with contrast.  Labs Review Labs Reviewed  BASIC METABOLIC PANEL - Abnormal; Notable for the following:    CO2 18 (*)    Calcium 8.4 (*)    All other components within normal limits  CBC WITH DIFFERENTIAL/PLATELET - Abnormal; Notable for the following:    Hemoglobin 7.9 (*)    HCT 25.3 (*)    MCV 62.3 (*)    MCH 19.5 (*)    RDW 17.9 (*)    All other components within normal limits  PREGNANCY, URINE    Imaging Review Ct Abdomen Pelvis W Contrast  08/04/2015  CLINICAL DATA:  Periumbilical pain, history of reducible umbilical hernia EXAM: CT ABDOMEN AND PELVIS WITH CONTRAST TECHNIQUE: Multidetector CT imaging of the abdomen and pelvis was performed using the standard protocol following bolus administration of intravenous contrast. CONTRAST:  129mL ISOVUE-300 IOPAMIDOL (ISOVUE-300) INJECTION 61% COMPARISON:  None. FINDINGS: Lower chest:  Lung bases are clear. Hepatobiliary: Liver is notable for 11 mm probable hemangioma in the right hepatic dome (series 2/image 17). Gallbladder is unremarkable. No intrahepatic or extrahepatic ductal dilatation. Pancreas: Within normal limits. Spleen: Within normal limits. Adrenals/Urinary Tract: Adrenal glands are within normal limits. Kidneys are within normal limits.  No hydronephrosis. Bladder is within normal  limits. Stomach/Bowel: Stomach is within normal limits. No evidence of bowel obstruction. Normal appendix (series 2/ image 51). Vascular/Lymphatic: No evidence of abdominal aortic aneurysm. No suspicious abdominopelvic lymphadenopathy. Reproductive: Uterine fibroids, including a dominant 2.3 cm submucosal fibroid in the posterior uterine body (sagittal image 46). Bilateral ovaries are within normal limits. Other: No abdominopelvic ascites. Small fat containing supraumbilical midline ventral hernia (series 2/ image 10). No associated inflammatory changes/fluid. Musculoskeletal: Mild degenerative changes at L5-S1. IMPRESSION: Small fat containing supraumbilical midline ventral hernia. No associated inflammatory changes/ fluid. No evidence of bowel obstruction.  Normal appendix. Electronically Signed   By: Julian Hy M.D.   On: 08/04/2015 22:27   I have personally reviewed and evaluated these images and lab results as part of my medical decision-making.    MDM   Final diagnoses:  Umbilical hernia without obstruction and without gangrene  Anemia, unspecified anemia type    I personally performed the services  described in this documentation, which was scribed in my presence. The recorded information has been reviewed and considered.    Leonard Schwartz, MD 08/04/15 2242

## 2015-08-04 NOTE — ED Notes (Signed)
Pt up to br

## 2015-08-04 NOTE — ED Notes (Signed)
Dr. Audie Pinto into room

## 2015-08-04 NOTE — ED Notes (Addendum)
Back from CT, alert, NAD, calm, interactive, "feel a little better", up to b/r by w/c, mother at [facility].

## 2015-08-04 NOTE — ED Notes (Signed)
Pt to CT

## 2016-06-24 ENCOUNTER — Emergency Department (HOSPITAL_BASED_OUTPATIENT_CLINIC_OR_DEPARTMENT_OTHER): Payer: Self-pay

## 2016-06-24 ENCOUNTER — Emergency Department (HOSPITAL_BASED_OUTPATIENT_CLINIC_OR_DEPARTMENT_OTHER)
Admission: EM | Admit: 2016-06-24 | Discharge: 2016-06-25 | Disposition: A | Discharge status: Home / Self Care | Payer: Self-pay | Attending: Emergency Medicine | Admitting: Emergency Medicine

## 2016-06-24 ENCOUNTER — Encounter (HOSPITAL_BASED_OUTPATIENT_CLINIC_OR_DEPARTMENT_OTHER): Payer: Self-pay | Admitting: *Deleted

## 2016-06-24 DIAGNOSIS — R0602 Shortness of breath: Secondary | ICD-10-CM | POA: Insufficient documentation

## 2016-06-24 DIAGNOSIS — R109 Unspecified abdominal pain: Secondary | ICD-10-CM

## 2016-06-24 DIAGNOSIS — D500 Iron deficiency anemia secondary to blood loss (chronic): Secondary | ICD-10-CM | POA: Insufficient documentation

## 2016-06-24 DIAGNOSIS — D259 Leiomyoma of uterus, unspecified: Secondary | ICD-10-CM | POA: Insufficient documentation

## 2016-06-24 DIAGNOSIS — R609 Edema, unspecified: Secondary | ICD-10-CM | POA: Insufficient documentation

## 2016-06-24 DIAGNOSIS — J45909 Unspecified asthma, uncomplicated: Secondary | ICD-10-CM | POA: Insufficient documentation

## 2016-06-24 LAB — PREGNANCY, URINE: PREG TEST UR: NEGATIVE

## 2016-06-24 LAB — CBC WITH DIFFERENTIAL/PLATELET
BASOS ABS: 0 10*3/uL (ref 0.0–0.1)
Basophils Relative: 1 %
EOS ABS: 0.2 10*3/uL (ref 0.0–0.7)
Eosinophils Relative: 5 %
HCT: 25.4 % — ABNORMAL LOW (ref 36.0–46.0)
Hemoglobin: 7.8 g/dL — ABNORMAL LOW (ref 12.0–15.0)
LYMPHS ABS: 1.2 10*3/uL (ref 0.7–4.0)
Lymphocytes Relative: 32 %
MCH: 19.1 pg — ABNORMAL LOW (ref 26.0–34.0)
MCHC: 30.7 g/dL (ref 30.0–36.0)
MCV: 62.3 fL — ABNORMAL LOW (ref 78.0–100.0)
MONO ABS: 0.6 10*3/uL (ref 0.1–1.0)
MONOS PCT: 17 %
NEUTROS ABS: 1.7 10*3/uL (ref 1.7–7.7)
Neutrophils Relative %: 45 %
PLATELETS: 299 10*3/uL (ref 150–400)
RBC: 4.08 MIL/uL (ref 3.87–5.11)
RDW: 18.2 % — AB (ref 11.5–15.5)
WBC: 3.7 10*3/uL — AB (ref 4.0–10.5)

## 2016-06-24 LAB — BASIC METABOLIC PANEL
ANION GAP: 6 (ref 5–15)
BUN: 12 mg/dL (ref 6–20)
CALCIUM: 9 mg/dL (ref 8.9–10.3)
CO2: 25 mmol/L (ref 22–32)
Chloride: 107 mmol/L (ref 101–111)
Creatinine, Ser: 0.6 mg/dL (ref 0.44–1.00)
Glucose, Bld: 95 mg/dL (ref 65–99)
Potassium: 3.8 mmol/L (ref 3.5–5.1)
Sodium: 138 mmol/L (ref 135–145)

## 2016-06-24 LAB — HEPATIC FUNCTION PANEL
ALBUMIN: 4.1 g/dL (ref 3.5–5.0)
ALT: 10 U/L — ABNORMAL LOW (ref 14–54)
AST: 23 U/L (ref 15–41)
Alkaline Phosphatase: 55 U/L (ref 38–126)
Total Bilirubin: 0.6 mg/dL (ref 0.3–1.2)
Total Protein: 7.7 g/dL (ref 6.5–8.1)

## 2016-06-24 LAB — D-DIMER, QUANTITATIVE (NOT AT ARMC): D DIMER QUANT: 0.29 ug{FEU}/mL (ref 0.00–0.50)

## 2016-06-24 LAB — URINALYSIS, ROUTINE W REFLEX MICROSCOPIC
BILIRUBIN URINE: NEGATIVE
Glucose, UA: NEGATIVE mg/dL
HGB URINE DIPSTICK: NEGATIVE
KETONES UR: NEGATIVE mg/dL
Leukocytes, UA: NEGATIVE
NITRITE: NEGATIVE
PROTEIN: NEGATIVE mg/dL
SPECIFIC GRAVITY, URINE: 1.02 (ref 1.005–1.030)
pH: 5.5 (ref 5.0–8.0)

## 2016-06-24 LAB — LIPASE, BLOOD: Lipase: 28 U/L (ref 11–51)

## 2016-06-24 LAB — BRAIN NATRIURETIC PEPTIDE: B NATRIURETIC PEPTIDE 5: 7.5 pg/mL (ref 0.0–100.0)

## 2016-06-24 MED ORDER — IOPAMIDOL (ISOVUE-300) INJECTION 61%
100.0000 mL | Freq: Once | INTRAVENOUS | Status: AC | PRN
  Administered 2016-06-24: 100 mL via INTRAVENOUS

## 2016-06-24 MED ORDER — IOPAMIDOL (ISOVUE-M 300) INJECTION 61%: 15.0000 mL | Freq: Once | INTRAMUSCULAR | Status: DC | PRN

## 2016-06-24 NOTE — ED Provider Notes (Signed)
[facility] DEPT Provider Note   CSN: [id number] Arrival date & time: 06/24/16  1649  By signing my name below, I, Dolores Hoose, attest that this documentation has been prepared under the direction and in the presence of Julianne Rice, MD . Electronically Signed: Dolores Hoose, Scribe. 06/24/2016. 9:08 PM.  History   Chief Complaint Chief Complaint  Patient presents with  . Leg Swelling   The history is provided by the patient. No language interpreter was used.    HPI Comments:  Marilyn Rodgers is a 36 y.o. female with pmhx of anemia who presents to the Emergency Department complaining of sudden-onset, constant lower extremity swelling which she first noticed today. Pt states that she has experienced some ankle swelling before in the past but her symptoms today are particularly severe. No mechanism of injury or recent travel indicated. She reports associated pain to the area, intermittent RLQ abdominal pain and R flank pain. Pt does not indicated any modifying factors for her LE swelling, but states her abdominal pain was mildly alleviated by urinating. She denies any nausea, constipation, bloody stool, vomiting or SOB.    Past Medical History:  Diagnosis Date  . Anemia   . Asthma   . GERD (gastroesophageal reflux disease)   . Shortness of breath   . Umbilical hernia     Patient Active Problem List   Diagnosis Date Noted  . Burn 01/01/2012  . CHEST PAIN 12/14/2009  . DENTAL PAIN 11/06/2009  . HEADACHE 11/06/2009  . ABDOMINAL PAIN 02/19/2009  . 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 BREAST TENDERNESS 10/06/2008  . TIREDNESS 10/06/2008  . COSTOCHONDRITIS 09/07/2007  . ANEMIA 05/05/2007  . HERNIA 05/05/2007  . DIARRHEA, RECURRENT 05/05/2007  . GERD 12/17/2006  . HIDRADENITIS SUPPURATIVA 08/03/2003    Past Surgical History:  Procedure Laterality Date  . NO PAST SURGERIES      OB History    Gravida Para Term Preterm AB Living   0             SAB TAB Ectopic Multiple Live Births                  Home Medications    Prior to Admission medications   Medication Sig Start Date End Date Taking? Authorizing Provider  albuterol (PROVENTIL HFA;VENTOLIN HFA) 108 (90 BASE) MCG/ACT inhaler Inhale 2 puffs into the lungs every 4 (four) hours as needed for wheezing. 08/18/11 08/17/12  Linton Flemings, MD  albuterol (PROVENTIL HFA;VENTOLIN HFA) 108 (90 BASE) MCG/ACT inhaler Inhale 2 puffs into the lungs every 4 (four) hours as needed for wheezing. 10/10/11 10/09/12  Ignacia Felling, PA-C  bacitracin ointment Apply topically 2 (two) times daily. Apply to face twice daily. 01/01/12   Pamella Pert, MD  budesonide-formoterol (SYMBICORT) 160-4.5 MCG/ACT inhaler Inhale 2 puffs into the lungs daily. 12/28/13   Waldemar Dickens, MD  diclofenac (VOLTAREN) 75 MG EC tablet Take 1 tablet (75 mg total) by mouth 2 (two) times daily. 12/28/13   Waldemar Dickens, MD  dicyclomine (BENTYL) 20 MG tablet Take 1 tablet (20 mg total) by mouth 2 (two) times daily as needed for spasms. 06/25/16   Julianne Rice, MD  docusate sodium (COLACE) 100 MG capsule Take 1 capsule (100 mg total) by mouth every 12 (twelve) hours. 06/25/16   Julianne Rice, MD  ferrous sulfate 325 (65 FE) MG tablet Take 1 tablet (325 mg total) by mouth daily. 06/25/16   Julianne Rice, MD  fluticasone (FLONASE) 50 MCG/ACT nasal spray Place 1-2  sprays into both nostrils daily. 12/28/13   Waldemar Dickens, MD  oxyCODONE-acetaminophen (PERCOCET) 5-325 MG per tablet Take 1 tablet by mouth every 6 (six) hours as needed for pain. 01/01/12   Pamella Pert, MD  silver sulfADIAZINE (SILVADENE) 1 % cream Apply topically daily. Apply to arm, do not apply to face. 01/01/12   Pamella Pert, MD  traMADol (ULTRAM) 50 MG tablet Take 1 tablet (50 mg total) by mouth every 6 (six) hours as needed. 08/04/15   Leonard Schwartz, MD    Family History Family History  Problem Relation Age of Onset  . Diabetes Maternal Aunt   . Heart disease Maternal Aunt   . Hypertension  Maternal Aunt   . Cancer Paternal Aunt   . Arthritis Paternal Grandmother   . Diabetes Paternal Grandmother     Social History Social History  Substance Use Topics  . Smoking status: Never Smoker  . Smokeless tobacco: Never Used  . Alcohol use Yes     Comment: wine occaisionally     Allergies   Other   Review of Systems Review of Systems  Constitutional: Negative for chills, fatigue and fever.  Respiratory: Negative for cough, shortness of breath and wheezing.   Cardiovascular: Positive for leg swelling. Negative for chest pain and palpitations.  Gastrointestinal: Positive for abdominal pain. Negative for blood in stool, constipation, nausea and vomiting.  Genitourinary: Positive for flank pain. Negative for difficulty urinating and dysuria.  Musculoskeletal: Positive for back pain and myalgias. Negative for arthralgias, joint swelling, neck pain and neck stiffness.  Skin: Negative for rash and wound.  Neurological: Negative for dizziness, weakness, light-headedness, numbness and headaches.  All other systems reviewed and are negative.    Physical Exam Updated Vital Signs BP 113/65   Pulse 81   Temp 98.2 F (36.8 C) (Oral)   Resp 20   Ht 5\' 4"  (1.626 m)   Wt 135 lb 6.4 oz (61.4 kg)   LMP 05/27/2016   SpO2 100%   BMI 23.24 kg/m   Physical Exam  Constitutional: She is oriented to person, place, and time. She appears well-developed and well-nourished. No distress.  HENT:  Head: Normocephalic and atraumatic.  Mouth/Throat: Oropharynx is clear and moist. No oropharyngeal exudate.  Eyes: EOM are normal. Pupils are equal, round, and reactive to light.  Neck: Normal range of motion. Neck supple.  Cardiovascular: Normal rate and regular rhythm.  Exam reveals no gallop and no friction rub.   No murmur heard. Pulmonary/Chest: Effort normal and breath sounds normal. No respiratory distress. She has no wheezes. She has no rales. She exhibits no tenderness.  Abdominal:  Soft. Bowel sounds are normal. There is no tenderness. There is no rebound and no guarding.  Musculoskeletal: Normal range of motion. She exhibits edema. She exhibits no tenderness.  1+ bilateral lower extremity pedal edema. No calf swelling or asymmetry. Distal pulses are 2+. Mild right-sided CVA tenderness.  Neurological: She is alert and oriented to person, place, and time.  5/5 motor in all extremities. Sensation fully intact.  Skin: Skin is warm and dry. Capillary refill takes less than 2 seconds. No rash noted. No erythema.  Psychiatric: She has a normal mood and affect. Her behavior is normal.  Nursing note and vitals reviewed.    ED Treatments / Results  DIAGNOSTIC STUDIES:  Oxygen Saturation is 100% on RA, normal by my interpretation.    COORDINATION OF CARE:  9:34 PM Discussed treatment plan with pt at bedside which includes imaging and  pt agreed to plan.  Labs (all labs ordered are listed, but only abnormal results are displayed) Labs Reviewed  CBC WITH DIFFERENTIAL/PLATELET - Abnormal; Notable for the following:       Result Value   WBC 3.7 (*)    Hemoglobin 7.8 (*)    HCT 25.4 (*)    MCV 62.3 (*)    MCH 19.1 (*)    RDW 18.2 (*)    All other components within normal limits  HEPATIC FUNCTION PANEL - Abnormal; Notable for the following:    ALT 10 (*)    Bilirubin, Direct <0.1 (*)    All other components within normal limits  URINALYSIS, ROUTINE W REFLEX MICROSCOPIC  PREGNANCY, URINE  BASIC METABOLIC PANEL  LIPASE, BLOOD  D-DIMER, QUANTITATIVE (NOT AT [facility])  BRAIN NATRIURETIC PEPTIDE    EKG  EKG Interpretation  Date/Time:  Tuesday June 24 2016 22:00:13 EDT Ventricular Rate:  80 PR Interval:    QRS Duration: 87 QT Interval:  346 QTC Calculation: 400 R Axis:   62 Text Interpretation:  Sinus rhythm Normal ECG No significant change since last tracing Confirmed by Maryan Rued  MD, Loree Fee ([id number]) on 06/25/2016 9:03:28 PM       Radiology No results  found.  Procedures Procedures (including critical care time)  Medications Ordered in ED Medications  iopamidol (ISOVUE-300) 61 % injection 100 mL (100 mLs Intravenous Contrast Given 06/24/16 2315)     Initial Impression / Assessment and Plan / ED Course  I have reviewed the triage vital signs and the nursing notes.  Pertinent labs & imaging results that were available during my care of the patient were reviewed by me and considered in my medical decision making (see chart for details).     Discuss CT findings with patient. States her swelling has resolved now feet have been elevated. Advised compression stockings. No suspicion for DVT/PE. Patient does have history of anemia and dysmenorrhea. Evidence of uterine fibroids on CT. Advised follow-up with OB/GYN. Return precautions given.  Final Clinical Impressions(s) / ED Diagnoses   Final diagnoses:  Peripheral edema  Abdominal spasms  Uterine leiomyoma, unspecified location  Iron deficiency anemia due to chronic blood loss    New Prescriptions Discharge Medication List as of 06/25/2016 12:30 AM    START taking these medications   Details  dicyclomine (BENTYL) 20 MG tablet Take 1 tablet (20 mg total) by mouth 2 (two) times daily as needed for spasms., Starting Wed 06/25/2016, Print    docusate sodium (COLACE) 100 MG capsule Take 1 capsule (100 mg total) by mouth every 12 (twelve) hours., Starting Wed 06/25/2016, Print    ferrous sulfate 325 (65 FE) MG tablet Take 1 tablet (325 mg total) by mouth daily., Starting Wed 06/25/2016, Print      I personally performed the services described in this documentation, which was scribed in my presence. The recorded information has been reviewed and is accurate.       Julianne Rice, MD 06/27/16 [phone]

## 2016-06-24 NOTE — ED Triage Notes (Signed)
Swelling in both of her ankles today. She has an umbilical hernia that she normally can reduce and now she is unable to. This started "a long time ago" per pt.

## 2016-06-25 MED ORDER — DOCUSATE SODIUM 100 MG PO CAPS: 100.0000 mg | ORAL_CAPSULE | Freq: Two times a day (BID) | ORAL | 0 refills | Status: AC

## 2016-06-25 MED ORDER — FERROUS SULFATE 325 (65 FE) MG PO TABS: 325.0000 mg | ORAL_TABLET | Freq: Every day | ORAL | 0 refills | Status: DC

## 2016-06-25 MED ORDER — DICYCLOMINE HCL 20 MG PO TABS
20.0000 mg | ORAL_TABLET | Freq: Two times a day (BID) | ORAL | 0 refills | Status: AC | PRN
Start: 2016-06-25 — End: ?

## 2016-12-02 ENCOUNTER — Encounter (HOSPITAL_BASED_OUTPATIENT_CLINIC_OR_DEPARTMENT_OTHER): Payer: Self-pay | Admitting: *Deleted

## 2016-12-02 ENCOUNTER — Emergency Department (HOSPITAL_BASED_OUTPATIENT_CLINIC_OR_DEPARTMENT_OTHER)
Admission: EM | Admit: 2016-12-02 | Discharge: 2016-12-02 | Disposition: A | Discharge status: Home / Self Care | Payer: Self-pay | Attending: Emergency Medicine | Admitting: Emergency Medicine

## 2016-12-02 ENCOUNTER — Emergency Department (HOSPITAL_BASED_OUTPATIENT_CLINIC_OR_DEPARTMENT_OTHER): Payer: Self-pay

## 2016-12-02 DIAGNOSIS — M545 Low back pain, unspecified: Secondary | ICD-10-CM

## 2016-12-02 DIAGNOSIS — J45909 Unspecified asthma, uncomplicated: Secondary | ICD-10-CM | POA: Insufficient documentation

## 2016-12-02 DIAGNOSIS — Z79899 Other long term (current) drug therapy: Secondary | ICD-10-CM | POA: Insufficient documentation

## 2016-12-02 DIAGNOSIS — D259 Leiomyoma of uterus, unspecified: Secondary | ICD-10-CM | POA: Insufficient documentation

## 2016-12-02 DIAGNOSIS — D649 Anemia, unspecified: Secondary | ICD-10-CM | POA: Insufficient documentation

## 2016-12-02 LAB — URINALYSIS, ROUTINE W REFLEX MICROSCOPIC
BILIRUBIN URINE: NEGATIVE
Glucose, UA: NEGATIVE mg/dL
Ketones, ur: NEGATIVE mg/dL
Leukocytes, UA: NEGATIVE
Nitrite: NEGATIVE
PH: 5 (ref 5.0–8.0)
Protein, ur: NEGATIVE mg/dL
SPECIFIC GRAVITY, URINE: 1.015 (ref 1.005–1.030)

## 2016-12-02 LAB — CBC WITH DIFFERENTIAL/PLATELET
BASOS ABS: 0 10*3/uL (ref 0.0–0.1)
Basophils Relative: 1 %
EOS PCT: 5 %
Eosinophils Absolute: 0.2 10*3/uL (ref 0.0–0.7)
HEMATOCRIT: 28.6 % — AB (ref 36.0–46.0)
HEMOGLOBIN: 8.8 g/dL — AB (ref 12.0–15.0)
LYMPHS ABS: 1.2 10*3/uL (ref 0.7–4.0)
LYMPHS PCT: 35 %
MCH: 19.2 pg — ABNORMAL LOW (ref 26.0–34.0)
MCHC: 30.8 g/dL (ref 30.0–36.0)
MCV: 62.3 fL — ABNORMAL LOW (ref 78.0–100.0)
MONOS PCT: 21 %
Monocytes Absolute: 0.7 10*3/uL (ref 0.1–1.0)
Neutro Abs: 1.4 10*3/uL — ABNORMAL LOW (ref 1.7–7.7)
Neutrophils Relative %: 38 %
Platelets: 331 10*3/uL (ref 150–400)
RBC: 4.59 MIL/uL (ref 3.87–5.11)
RDW: 18.4 % — ABNORMAL HIGH (ref 11.5–15.5)
WBC: 3.5 10*3/uL — AB (ref 4.0–10.5)

## 2016-12-02 LAB — COMPREHENSIVE METABOLIC PANEL
ALT: 13 U/L — ABNORMAL LOW (ref 14–54)
AST: 31 U/L (ref 15–41)
Albumin: 4.2 g/dL (ref 3.5–5.0)
Alkaline Phosphatase: 57 U/L (ref 38–126)
Anion gap: 9 (ref 5–15)
BILIRUBIN TOTAL: 0.4 mg/dL (ref 0.3–1.2)
BUN: 9 mg/dL (ref 6–20)
CALCIUM: 9.1 mg/dL (ref 8.9–10.3)
CO2: 24 mmol/L (ref 22–32)
Chloride: 103 mmol/L (ref 101–111)
Creatinine, Ser: 0.66 mg/dL (ref 0.44–1.00)
GFR calc Af Amer: >60 mL/min (ref >60)
Glucose, Bld: 114 mg/dL — ABNORMAL HIGH (ref 65–99)
POTASSIUM: 3.7 mmol/L (ref 3.5–5.1)
Sodium: 136 mmol/L (ref 135–145)
TOTAL PROTEIN: 8 g/dL (ref 6.5–8.1)

## 2016-12-02 LAB — URINALYSIS, MICROSCOPIC (REFLEX)

## 2016-12-02 LAB — LIPASE, BLOOD: LIPASE: 29 U/L (ref 11–51)

## 2016-12-02 LAB — PREGNANCY, URINE: PREG TEST UR: NEGATIVE

## 2016-12-02 MED ORDER — HYDROCODONE-ACETAMINOPHEN 5-325 MG PO TABS
2.0000 | ORAL_TABLET | Freq: Once | ORAL | Status: AC
  Administered 2016-12-02: 2 via ORAL
  Filled 2016-12-02: qty 2

## 2016-12-02 MED ORDER — NAPROXEN 375 MG PO TABS: 375.0000 mg | ORAL_TABLET | Freq: Two times a day (BID) | ORAL | 0 refills | Status: AC

## 2016-12-02 MED ORDER — KETOROLAC TROMETHAMINE 15 MG/ML IJ SOLN
15.0000 mg | Freq: Once | INTRAMUSCULAR | Status: DC
  Filled 2016-12-02: qty 1

## 2016-12-02 MED ORDER — HYDROCODONE-ACETAMINOPHEN 5-325 MG PO TABS
1.0000 | ORAL_TABLET | ORAL | 0 refills | Status: DC | PRN
Start: 2016-12-02 — End: 2017-04-04

## 2016-12-02 MED ORDER — METHYLPREDNISOLONE 4 MG PO TBPK: ORAL_TABLET | ORAL | 0 refills | Status: AC

## 2016-12-02 NOTE — ED Notes (Signed)
Pt educated about not driving or performing other critical tasks (such as operating heavy machinery, caring for infant/toddler/child) due to sedative nature of medications received in ED. Also warned about risks of consuming alcohol or taking other medications with sedative properties. Pt/caregiver verbalized understanding.  

## 2016-12-02 NOTE — Discharge Instructions (Signed)
Your labs/imaging were overall reassuring today.  Please note that you do have fibroids, as well as chronic, likely IRON DEFICIENCY anemia  Take Over-the-counter iron supplements or multivitamins WITH iron daily.   You need to follow-up int he next 1-2 weeks for a repeat exam as well as lab checks, to check your BLOOD LEVEL. It is very important that you do this.

## 2016-12-02 NOTE — ED Notes (Signed)
ED Provider at bedside. 

## 2016-12-02 NOTE — ED Notes (Signed)
Patient transported to CT 

## 2016-12-02 NOTE — ED Triage Notes (Signed)
Pt reports lower-mid back pain and LLQ pain x1wk. Denies fever, n/v/d. Reports pain gets better when she voids but then gets worse again. Denies abnormal vaginal discharge/bleeding.

## 2016-12-02 NOTE — ED Notes (Signed)
Walked pt to waiting room -- ride hasn't arrived at this time. Asked pt if she could contact her ride and she states he will call her when he gets here. Pt placed in hallway bed and states she knows to inform staff when her ride is here and we will walk her to waiting room.

## 2016-12-02 NOTE — ED Notes (Signed)
Pt educated about not driving or performing critical tasks (such as operating heavy machinery or caring for an infant/toddler/child) while taking narcotics. Educated about not mixing narcotics with alcohol and/or other sedatives (such as muscle relaxers, Benadryl). Also educated about addicting properties of narcotics. Pt/caregiver verbalized understanding.   

## 2016-12-02 NOTE — ED Provider Notes (Signed)
[facility] [facility] DEPT [facility] Provider Note   CSN: [id number] Arrival date & time: 12/02/16  7494     History   Chief Complaint Chief Complaint  Patient presents with  . Abdominal Pain    HPI Marilyn Rodgers is a 36 y.o. female.  HPI 36 year old female with past medical history as below here with left sided back pain. The patient states that she had gradual onset of progressively worsening lower back pain. The pain is localized to her midline and left paraspinal/flank area. The pain now radiates around towards the front of her left lower quadrant/abdomen. She denies any associated symptoms including no dysuria. In fact, her pain actually improves after urinating or having a bowel movement. She also endorses no change in bowel habits. No diarrhea. No nausea or vomiting. No fevers or chills. She is otherwise at her baseline set of health. She strongly denies any vaginal bleeding or discharge. Denies any dyspareunia. No other medical complaints. Pain is worse with any kind of movements and temporarily is relieved with certain position changes. No lower extremity numbness or weakness.  Past Medical History:  Diagnosis Date  . Anemia   . Asthma   . GERD (gastroesophageal reflux disease)   . Shortness of breath   . Umbilical hernia     Patient Active Problem List   Diagnosis Date Noted  . Burn 01/01/2012  . CHEST PAIN 12/14/2009  . DENTAL PAIN 11/06/2009  . HEADACHE 11/06/2009  . ABDOMINAL PAIN 02/19/2009  . 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 BREAST TENDERNESS 10/06/2008  . TIREDNESS 10/06/2008  . COSTOCHONDRITIS 09/07/2007  . ANEMIA 05/05/2007  . HERNIA 05/05/2007  . DIARRHEA, RECURRENT 05/05/2007  . GERD 12/17/2006  . HIDRADENITIS SUPPURATIVA 08/03/2003    Past Surgical History:  Procedure Laterality Date  . NO PAST SURGERIES      OB History    Gravida Para Term Preterm AB Living   0             SAB TAB Ectopic Multiple Live Births                   Home Medications    Prior to  Admission medications   Medication Sig Start Date End Date Taking? Authorizing Provider  albuterol (PROVENTIL HFA;VENTOLIN HFA) 108 (90 BASE) MCG/ACT inhaler Inhale 2 puffs into the lungs every 4 (four) hours as needed for wheezing. 08/18/11 08/17/12  Linton Flemings, MD  albuterol (PROVENTIL HFA;VENTOLIN HFA) 108 (90 BASE) MCG/ACT inhaler Inhale 2 puffs into the lungs every 4 (four) hours as needed for wheezing. 10/10/11 10/09/12  Ignacia Felling, PA-C  bacitracin ointment Apply topically 2 (two) times daily. Apply to face twice daily. 01/01/12   Pamella Pert, MD  budesonide-formoterol [facility]) 160-4.5 MCG/ACT inhaler Inhale 2 puffs into the lungs daily. 12/28/13   Waldemar Dickens, MD  diclofenac (VOLTAREN) 75 MG EC tablet Take 1 tablet (75 mg total) by mouth 2 (two) times daily. 12/28/13   Waldemar Dickens, MD  dicyclomine (BENTYL) 20 MG tablet Take 1 tablet (20 mg total) by mouth 2 (two) times daily as needed for spasms. 06/25/16   Julianne Rice, MD  docusate sodium (COLACE) 100 MG capsule Take 1 capsule (100 mg total) by mouth every 12 (twelve) hours. 06/25/16   Julianne Rice, MD  ferrous sulfate 325 (65 FE) MG tablet Take 1 tablet (325 mg total) by mouth daily. 06/25/16   Julianne Rice, MD  fluticasone (FLONASE) 50 MCG/ACT nasal spray Place 1-2 sprays into both nostrils daily.  12/28/13   Waldemar Dickens, MD  HYDROcodone-acetaminophen (NORCO/VICODIN) 5-325 MG tablet Take 1-2 tablets by mouth every 4 (four) hours as needed for severe pain. 12/02/16   Duffy Bruce, MD  methylPREDNISolone (MEDROL DOSEPAK) 4 MG TBPK tablet Take as directed on box 12/02/16   Duffy Bruce, MD  naproxen (NAPROSYN) 375 MG tablet Take 1 tablet (375 mg total) by mouth 2 (two) times daily with a meal. 12/02/16 12/07/16  Duffy Bruce, MD  oxyCODONE-acetaminophen (PERCOCET) 5-325 MG per tablet Take 1 tablet by mouth every 6 (six) hours as needed for pain. 01/01/12   Pamella Pert, MD  silver sulfADIAZINE (SILVADENE)  1 % cream Apply topically daily. Apply to arm, do not apply to face. 01/01/12   Pamella Pert, MD  traMADol (ULTRAM) 50 MG tablet Take 1 tablet (50 mg total) by mouth every 6 (six) hours as needed. 08/04/15   Leonard Schwartz, MD    Family History Family History  Problem Relation Age of Onset  . Diabetes Maternal Aunt   . Heart disease Maternal Aunt   . Hypertension Maternal Aunt   . Cancer Paternal Aunt   . Arthritis Paternal Grandmother   . Diabetes Paternal Grandmother     Social History Social History  Substance Use Topics  . Smoking status: Never Smoker  . Smokeless tobacco: Never Used  . Alcohol use Yes     Comment: wine occaisionally     Allergies   Other   Review of Systems Review of Systems  Constitutional: Negative for chills and fever.  HENT: Negative for congestion, rhinorrhea and sore throat.   Eyes: Negative for visual disturbance.  Respiratory: Negative for cough, shortness of breath and wheezing.   Cardiovascular: Negative for chest pain and leg swelling.  Gastrointestinal: Positive for abdominal pain. Negative for diarrhea, nausea and vomiting.  Genitourinary: Negative for dysuria, flank pain, vaginal bleeding and vaginal discharge.  Musculoskeletal: Positive for back pain and gait problem (Due to back pain). Negative for neck pain.  Skin: Negative for rash.  Allergic/Immunologic: Negative for immunocompromised state.  Neurological: Negative for syncope and headaches.  Hematological: Does not bruise/bleed easily.  All other systems reviewed and are negative.    Physical Exam Updated Vital Signs BP 104/66 (BP Location: Left Arm)   Pulse 84   Temp 97.9 F (36.6 C) (Oral)   Resp 14   Ht 5\' 6"  (1.676 m)   LMP 11/12/2016 (Approximate)   SpO2 100%   Physical Exam  Constitutional: She is oriented to person, place, and time. She appears well-developed and well-nourished. No distress.  HENT:  Head: Normocephalic and atraumatic.  Mouth/Throat:  Oropharynx is clear and moist.  Eyes: Conjunctivae are normal.  Neck: Neck supple.  Cardiovascular: Normal rate, regular rhythm and normal heart sounds.  Exam reveals no friction rub.   No murmur heard. Pulmonary/Chest: Effort normal and breath sounds normal. No respiratory distress. She has no wheezes. She has no rales.  Abdominal: Soft. Bowel sounds are normal. She exhibits no distension.  Musculoskeletal: She exhibits no edema.       Back:  Significant tenderness to palpation over left paraspinal area. No bruising or deformity. No midline tenderness.  Neurological: She is alert and oriented to person, place, and time. She exhibits normal muscle tone.  Skin: Skin is warm. Capillary refill takes less than 2 seconds.  Psychiatric: She has a normal mood and affect.  Nursing note and vitals reviewed.    ED Treatments / Results  Labs (all labs ordered are  listed, but only abnormal results are displayed) Labs Reviewed  URINALYSIS, ROUTINE W REFLEX MICROSCOPIC - Abnormal; Notable for the following:       Result Value   APPearance CLOUDY (*)    Hgb urine dipstick TRACE (*)    All other components within normal limits  CBC WITH DIFFERENTIAL/PLATELET - Abnormal; Notable for the following:    WBC 3.5 (*)    Hemoglobin 8.8 (*)    HCT 28.6 (*)    MCV 62.3 (*)    MCH 19.2 (*)    RDW 18.4 (*)    Neutro Abs 1.4 (*)    All other components within normal limits  COMPREHENSIVE METABOLIC PANEL - Abnormal; Notable for the following:    Glucose, Bld 114 (*)    ALT 13 (*)    All other components within normal limits  URINALYSIS, MICROSCOPIC (REFLEX) - Abnormal; Notable for the following:    Bacteria, UA MANY (*)    Squamous Epithelial / LPF 0-5 (*)    All other components within normal limits  PREGNANCY, URINE  LIPASE, BLOOD    EKG  EKG Interpretation None       Radiology Ct Renal Stone Study  Result Date: 12/02/2016 CLINICAL DATA:  Low back and left lower quadrant pain for 1  week. EXAM: CT ABDOMEN AND PELVIS WITHOUT CONTRAST TECHNIQUE: Multidetector CT imaging of the abdomen and pelvis was performed following the standard protocol without IV contrast. COMPARISON:  06/24/2016 FINDINGS: Lower chest:  Unremarkable. Hepatobiliary: No focal abnormality in the liver on this study without intravenous contrast. There is no evidence for gallstones, gallbladder wall thickening, or pericholecystic fluid. No intrahepatic or extrahepatic biliary dilation. Pancreas: No focal mass lesion. No dilatation of the main duct. No intraparenchymal cyst. No peripancreatic edema. Spleen: No splenomegaly. No focal mass lesion. Adrenals/Urinary Tract: No adrenal nodule or mass. Kidneys unremarkable. Specifically, no renal stones. No evidence for hydroureter. The urinary bladder appears normal for the degree of distention. Stomach/Bowel: Stomach is nondistended. No gastric wall thickening. No evidence of outlet obstruction. Duodenum is normally positioned as is the ligament of Treitz. No small bowel wall thickening. No small bowel dilatation. Low dose pulsed flouro and image save technique used. The terminal ileum is normal. The appendix is normal. No gross colonic mass. No colonic wall thickening. No substantial diverticular change. Vascular/Lymphatic: No abdominal aortic aneurysm. There is no gastrohepatic or hepatoduodenal ligament lymphadenopathy. No intraperitoneal or retroperitoneal lymphadenopathy. No pelvic sidewall lymphadenopathy. Reproductive: Multiple uterine fibroids evident, better demonstrated on the previous CT scan with contrast material. There is no adnexal mass. Other: Trace free fluid noted in the cul-de-sac. This can be physiologic in a premenopausal female. Musculoskeletal: Bone windows reveal no worrisome lytic or sclerotic osseous lesions. IMPRESSION: 1. Unremarkable CT scan of the abdomen and pelvis. Specifically, no findings to explain the patient's history of left lower quadrant pain. 2.  Uterine fibroids, better demonstrated on previous imaging. Electronically Signed   By: Misty Stanley M.D.   On: 12/02/2016 09:05    Procedures Procedures (including critical care time)  Medications Ordered in ED Medications  HYDROcodone-acetaminophen (NORCO/VICODIN) 5-325 MG per tablet 2 tablet (2 tablets Oral Given 12/02/16 0935)     Initial Impression / Assessment and Plan / ED Course  I have reviewed the triage vital signs and the nursing notes.  Pertinent labs & imaging results that were available during my care of the patient were reviewed by me and considered in my medical decision making (see chart for details).  55 old female here with midline and left paraspinal lower back pain. I suspect this is likely musculoskeletal. However, given the radiation to the abdomen, workup was obtained. Urinalysis is without evidence of UTI or hematuria. Her CT scan shows no evidence of stone, bony automatic, or other acute normality. She does have uterine fibroids but has no vaginal bleeding or symptoms to suggest this is the etiology. Her pain is not consistent with ovarian torsion. CBC shows persistent anemia. This is low MCV and I suspect is secondary to vaginal bleeding and iron deficiency anemia related to her fibroids. 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 These have been previously noted. She has no murmur or evidence of valvular disease. I will have her follow up very closely with the PCP. Otherwise, will give supportive care for likely lower back pain and sprain with possible radiculopathy. No evidence of cauda equina.   This note was prepared with assistance of Systems analyst. Occasional wrong-word or sound-a-like substitutions may have occurred due to the inherent limitations of voice recognition software.  Final Clinical Impressions(s) / ED Diagnoses   Final diagnoses:  Acute left-sided low back pain without sciatica  Uterine leiomyoma, unspecified location  Anemia,  unspecified type    New Prescriptions Discharge Medication List as of 12/02/2016  9:42 AM    START taking these medications   Details  HYDROcodone-acetaminophen (NORCO/VICODIN) 5-325 MG tablet Take 1-2 tablets by mouth every 4 (four) hours as needed for severe pain., Starting Tue 12/02/2016, Print    methylPREDNISolone (MEDROL DOSEPAK) 4 MG TBPK tablet Take as directed on box, Print    naproxen (NAPROSYN) 375 MG tablet Take 1 tablet (375 mg total) by mouth 2 (two) times daily with a meal., Starting Tue 12/02/2016, Until Sun 12/07/2016, Print         Duffy Bruce, MD 12/02/16 1517

## 2016-12-02 NOTE — ED Notes (Signed)
Pt states her ride will be here within approx 30 mins.

## 2016-12-26 MED FILL — HYDROCODON-APAP 5-325: 5-325 | 2 days supply | Qty: 12 | Fill #0

## 2016-12-26 MED FILL — NAPROXEN 375 MG TABLET: 375 | 5 days supply | Qty: 10 | Fill #0

## 2016-12-26 MED FILL — METHYLPREDNISOLONE 4 MG TAB: 4 | 6 days supply | Qty: 21 | Fill #0

## 2017-04-04 ENCOUNTER — Emergency Department (HOSPITAL_BASED_OUTPATIENT_CLINIC_OR_DEPARTMENT_OTHER)
Admission: EM | Admit: 2017-04-04 | Discharge: 2017-04-04 | Disposition: A | Discharge status: Home / Self Care | Payer: Self-pay | Attending: Emergency Medicine | Admitting: Emergency Medicine

## 2017-04-04 ENCOUNTER — Encounter (HOSPITAL_BASED_OUTPATIENT_CLINIC_OR_DEPARTMENT_OTHER): Payer: Self-pay | Admitting: Emergency Medicine

## 2017-04-04 ENCOUNTER — Other Ambulatory Visit: Payer: Self-pay

## 2017-04-04 ENCOUNTER — Emergency Department (HOSPITAL_BASED_OUTPATIENT_CLINIC_OR_DEPARTMENT_OTHER): Payer: Self-pay

## 2017-04-04 DIAGNOSIS — Z79899 Other long term (current) drug therapy: Secondary | ICD-10-CM | POA: Insufficient documentation

## 2017-04-04 DIAGNOSIS — R1032 Left lower quadrant pain: Secondary | ICD-10-CM | POA: Insufficient documentation

## 2017-04-04 DIAGNOSIS — J45909 Unspecified asthma, uncomplicated: Secondary | ICD-10-CM | POA: Insufficient documentation

## 2017-04-04 DIAGNOSIS — M79605 Pain in left leg: Secondary | ICD-10-CM | POA: Insufficient documentation

## 2017-04-04 LAB — CBC WITH DIFFERENTIAL/PLATELET
BASOS PCT: 1 %
Basophils Absolute: 0 10*3/uL (ref 0.0–0.1)
EOS ABS: 0.2 10*3/uL (ref 0.0–0.7)
EOS PCT: 5 %
HCT: 25.4 % — ABNORMAL LOW (ref 36.0–46.0)
HEMOGLOBIN: 7.9 g/dL — AB (ref 12.0–15.0)
LYMPHS PCT: 37 %
Lymphs Abs: 1.2 10*3/uL (ref 0.7–4.0)
MCH: 20.1 pg — AB (ref 26.0–34.0)
MCHC: 31.1 g/dL (ref 30.0–36.0)
MCV: 64.5 fL — AB (ref 78.0–100.0)
Monocytes Absolute: 0.5 10*3/uL (ref 0.1–1.0)
Monocytes Relative: 16 %
NEUTROS PCT: 41 %
Neutro Abs: 1.3 10*3/uL — ABNORMAL LOW (ref 1.7–7.7)
PLATELETS: 304 10*3/uL (ref 150–400)
RBC: 3.94 MIL/uL (ref 3.87–5.11)
RDW: 17.8 % — ABNORMAL HIGH (ref 11.5–15.5)
WBC: 3.2 10*3/uL — AB (ref 4.0–10.5)

## 2017-04-04 LAB — COMPREHENSIVE METABOLIC PANEL
ALK PHOS: 60 U/L (ref 38–126)
ALT: 6 U/L — AB (ref 14–54)
AST: 20 U/L (ref 15–41)
Albumin: 4.2 g/dL (ref 3.5–5.0)
Anion gap: 8 (ref 5–15)
BUN: 10 mg/dL (ref 6–20)
CALCIUM: 9 mg/dL (ref 8.9–10.3)
CHLORIDE: 105 mmol/L (ref 101–111)
CO2: 26 mmol/L (ref 22–32)
CREATININE: 0.66 mg/dL (ref 0.44–1.00)
GFR calc non Af Amer: >60 mL/min (ref >60)
Glucose, Bld: 108 mg/dL — ABNORMAL HIGH (ref 65–99)
Potassium: 3.3 mmol/L — ABNORMAL LOW (ref 3.5–5.1)
SODIUM: 139 mmol/L (ref 135–145)
Total Bilirubin: 0.5 mg/dL (ref 0.3–1.2)
Total Protein: 7.8 g/dL (ref 6.5–8.1)

## 2017-04-04 LAB — URINALYSIS, MICROSCOPIC (REFLEX)

## 2017-04-04 LAB — PREGNANCY, URINE: Preg Test, Ur: NEGATIVE

## 2017-04-04 LAB — URINALYSIS, ROUTINE W REFLEX MICROSCOPIC
Bilirubin Urine: NEGATIVE
GLUCOSE, UA: NEGATIVE mg/dL
Ketones, ur: 15 mg/dL — AB
Leukocytes, UA: NEGATIVE
Nitrite: NEGATIVE
PH: 5.5 (ref 5.0–8.0)
PROTEIN: NEGATIVE mg/dL

## 2017-04-04 LAB — LIPASE, BLOOD: Lipase: 21 U/L (ref 11–51)

## 2017-04-04 MED ORDER — HYDROCODONE-ACETAMINOPHEN 5-325 MG PO TABS: 1.0000 | ORAL_TABLET | Freq: Four times a day (QID) | ORAL | 0 refills | Status: AC | PRN

## 2017-04-04 MED ORDER — NAPROXEN 500 MG PO TABS: 500.0000 mg | ORAL_TABLET | Freq: Two times a day (BID) | ORAL | 0 refills | Status: AC

## 2017-04-04 MED ORDER — HYDROCODONE-ACETAMINOPHEN 5-325 MG PO TABS: 2.0000 | ORAL_TABLET | Freq: Once | ORAL | Status: DC

## 2017-04-04 NOTE — ED Notes (Signed)
Patient transported to Ultrasound 

## 2017-04-04 NOTE — ED Notes (Signed)
Given ice chips.

## 2017-04-04 NOTE — ED Provider Notes (Signed)
[facility] Provider Note   CSN: [id number] Arrival date & time: 04/04/17  3299     History   Chief Complaint Chief Complaint  Patient presents with  . Leg Pain  . Abdominal Pain    HPI Marilyn Rodgers is a 36 y.o. female.  HPI 36 year old female who presents with left lower abdominal pain and left leg pain.  Onset 3-4 days ago.  History of umbilical hernia, no prior abdominal surgeries, and asthma.  States that she has had similar symptoms in her left lower quadrant, for which she was seen in the ED for in August 2018.  Denies associating nausea, vomiting, diarrhea, fevers or chills, dysuria, hematuria or urinary frequency, abnormal vaginal discharge.  She is currently on her menstruation.  No cough, congestion, chest pain or difficulty breathing.  Also complains of left calf tenderness, feels as of fullness.  Mild ankle edema associated with this.  No exogenous hormones, recent immobilization, prior history of PE/DVT.  No shortness of breath or chest pain.  No fall or trauma.  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   Past Medical History:  Diagnosis Date  . Anemia   . Asthma   . GERD (gastroesophageal reflux disease)   . Shortness of breath   . Umbilical hernia     Patient Active Problem List   Diagnosis Date Noted  . Burn 01/01/2012  . CHEST PAIN 12/14/2009  . DENTAL PAIN 11/06/2009  . HEADACHE 11/06/2009  . ABDOMINAL PAIN 02/19/2009  . 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 BREAST TENDERNESS 10/06/2008  . TIREDNESS 10/06/2008  . COSTOCHONDRITIS 09/07/2007  . ANEMIA 05/05/2007  . HERNIA 05/05/2007  . DIARRHEA, RECURRENT 05/05/2007  . GERD 12/17/2006  . HIDRADENITIS SUPPURATIVA 08/03/2003    Past Surgical History:  Procedure Laterality Date  . NO PAST SURGERIES      OB History    Gravida Para Term Preterm AB Living   0             SAB TAB Ectopic Multiple Live Births                   Home Medications    Prior to Admission  medications   Medication Sig Start Date End Date Taking? Authorizing Provider  albuterol (PROVENTIL HFA;VENTOLIN HFA) 108 (90 BASE) MCG/ACT inhaler Inhale 2 puffs into the lungs every 4 (four) hours as needed for wheezing. 08/18/11 08/17/12  Linton Flemings, MD  albuterol (PROVENTIL HFA;VENTOLIN HFA) 108 (90 BASE) MCG/ACT inhaler Inhale 2 puffs into the lungs every 4 (four) hours as needed for wheezing. 10/10/11 10/09/12  Ignacia Felling, PA-C  bacitracin ointment Apply topically 2 (two) times daily. Apply to face twice daily. 01/01/12   Pamella Pert, MD  budesonide-formoterol [facility]) 160-4.5 MCG/ACT inhaler Inhale 2 puffs into the lungs daily. 12/28/13   Waldemar Dickens, MD  diclofenac (VOLTAREN) 75 MG EC tablet Take 1 tablet (75 mg total) by mouth 2 (two) times daily. 12/28/13   Waldemar Dickens, MD  dicyclomine (BENTYL) 20 MG tablet Take 1 tablet (20 mg total) by mouth 2 (two) times daily as needed for spasms. 06/25/16   Julianne Rice, MD  docusate sodium (COLACE) 100 MG capsule Take 1 capsule (100 mg total) by mouth every 12 (twelve) hours. 06/25/16   Julianne Rice, MD  ferrous sulfate 325 (65 FE) MG tablet Take 1 tablet (325 mg total) by mouth daily. 06/25/16   Julianne Rice, MD  fluticasone (FLONASE) 50 MCG/ACT nasal  spray Place 1-2 sprays into both nostrils daily. 12/28/13   Waldemar Dickens, MD  HYDROcodone-acetaminophen (NORCO/VICODIN) 5-325 MG tablet Take 1-2 tablets by mouth every 4 (four) hours as needed for severe pain. 12/02/16   Duffy Bruce, MD  methylPREDNISolone (MEDROL DOSEPAK) 4 MG TBPK tablet Take as directed on box 12/02/16   Duffy Bruce, MD  oxyCODONE-acetaminophen (PERCOCET) 5-325 MG per tablet Take 1 tablet by mouth every 6 (six) hours as needed for pain. 01/01/12   Pamella Pert, MD  silver sulfADIAZINE (SILVADENE) 1 % cream Apply topically daily. Apply to arm, do not apply to face. 01/01/12   Pamella Pert, MD  traMADol (ULTRAM) 50 MG tablet Take 1 tablet (50  mg total) by mouth every 6 (six) hours as needed. 08/04/15   Leonard Schwartz, MD    Family History Family History  Problem Relation Age of Onset  . Diabetes Maternal Aunt   . Heart disease Maternal Aunt   . Hypertension Maternal Aunt   . Cancer Paternal Aunt   . Arthritis Paternal Grandmother   . Diabetes Paternal Grandmother     Social History Social History   Tobacco Use  . Smoking status: Never Smoker  . Smokeless tobacco: Never Used  Substance Use Topics  . Alcohol use: Yes    Comment: wine occaisionally  . Drug use: No     Allergies   Other   Review of Systems Review of Systems  Constitutional: Negative for fever.  Respiratory: Negative for shortness of breath.   Cardiovascular: Negative for chest pain.  Gastrointestinal: Positive for abdominal pain.  Genitourinary: Negative for flank pain, pelvic pain, vaginal discharge and vaginal pain.  All other systems reviewed and are negative.    Physical Exam Updated Vital Signs BP 119/89 (BP Location: Right Arm)   Pulse 84   Temp 98.8 F (37.1 C) (Oral)   Resp 16   Ht 5\' 6"  (1.676 m)   Wt 59 kg (130 lb 1.1 oz)   LMP 04/01/2017   SpO2 100%   BMI 20.99 kg/m   Physical Exam Physical Exam  Nursing note and vitals reviewed. Constitutional: Well developed, well nourished, non-toxic, and in no acute distress Head: Normocephalic and atraumatic.  Mouth/Throat: Oropharynx is clear and moist.  Neck: Normal range of motion. Neck supple.  Cardiovascular: Normal rate and regular rhythm.   Pulmonary/Chest: Effort normal and breath sounds normal.  Abdominal: Soft. There is LLQ tenderness. There is no rebound and no guarding. No CVA tenderness.  No tenderness in the pelvis or adnexal regions. Musculoskeletal: Normal range of motion.  Neurological: Alert, no facial droop, fluent speech, moves all extremities symmetrically Skin: Skin is warm and dry.  Psychiatric: Cooperative   ED Treatments / Results  Labs (all labs  ordered are listed, but only abnormal results are displayed) Labs Reviewed  URINALYSIS, ROUTINE W REFLEX MICROSCOPIC - Abnormal; Notable for the following components:      Result Value   Specific Gravity, Urine >1.030 (*)    Hgb urine dipstick SMALL (*)    Ketones, ur 15 (*)    All other components within normal limits  CBC WITH DIFFERENTIAL/PLATELET - Abnormal; Notable for the following components:   WBC 3.2 (*)    Hemoglobin 7.9 (*)    HCT 25.4 (*)    MCV 64.5 (*)    MCH 20.1 (*)    RDW 17.8 (*)    Neutro Abs 1.3 (*)    All other components within normal limits  COMPREHENSIVE METABOLIC PANEL -  Abnormal; Notable for the following components:   Potassium 3.3 (*)    Glucose, Bld 108 (*)    ALT 6 (*)    All other components within normal limits  URINALYSIS, MICROSCOPIC (REFLEX) - Abnormal; Notable for the following components:   Bacteria, UA MANY (*)    Squamous Epithelial / LPF 0-5 (*)    All other components within normal limits  PREGNANCY, URINE  LIPASE, BLOOD    EKG  EKG Interpretation None       Radiology US Venous Img Lower Unilateral Left  Result Date: 04/04/2017 CLINICAL DATA:  Acute left calf pain. EXAM: Left LOWER EXTREMITY VENOUS DOPPLER ULTRASOUND TECHNIQUE: Gray-scale sonography with graded compression, as well as color Doppler and duplex ultrasound were performed to evaluate the lower extremity deep venous systems from the level of the common femoral vein and including the common femoral, femoral, profunda femoral, popliteal and calf veins including the posterior tibial, peroneal and gastrocnemius veins when visible. The superficial great saphenous vein was also interrogated. Spectral Doppler was utilized to evaluate flow at rest and with distal augmentation maneuvers in the common femoral, femoral and popliteal veins. COMPARISON:  None. FINDINGS: Contralateral Common Femoral Vein: Respiratory phasicity is normal and symmetric with the symptomatic side. No  evidence of thrombus. Normal compressibility. Common Femoral Vein: No evidence of thrombus. Normal compressibility, respiratory phasicity and response to augmentation. Saphenofemoral Junction: No evidence of thrombus. Normal compressibility and flow on color Doppler imaging. Profunda Femoral Vein: No evidence of thrombus. Normal compressibility and flow on color Doppler imaging. Femoral Vein: No evidence of thrombus. Normal compressibility, respiratory phasicity and response to augmentation. Popliteal Vein: No evidence of thrombus. Normal compressibility, respiratory phasicity and response to augmentation. Calf Veins: No evidence of thrombus. Normal compressibility and flow on color Doppler imaging. Superficial Great Saphenous Vein: No evidence of thrombus. Normal compressibility. Venous Reflux:  None. Other Findings:  None. IMPRESSION: No evidence of deep venous thrombosis seen in left lower extremity. Electronically Signed   By: Marijo Conception, M.D.   On: 04/04/2017 09:32    Procedures Procedures (including critical care time)  Medications Ordered in ED Medications - No data to display   Initial Impression / Assessment and Plan / ED Course  I have reviewed the triage vital signs and the nursing notes.  Pertinent labs & imaging results that were available during my care of the patient were reviewed by me and considered in my medical decision making (see chart for details).     36 year old female who presents with left-sided abdominal pain.  She is nontoxic in no acute distress with normal vital signs.  Her abdomen is soft and nonsurgical, primarily tenderness in the left upper middle quadrant.  No adnexal or pelvic tenderness to suggest ovarian cysts or torsion.  UA with small hemoglobin, but she is currently on her menses.  Blood work overall reassuring.  Left lower leg exam overall reassuring.  Extremities neurovascularly intact.  Given some calf tenderness and ultrasound was performed to  evaluate for DVT.  This shows no acute processes on ultrasound.  Records are reviewed.  Patient reports similar presentation in August 2018.  She did not underwent CT at that time which was unremarkable and diagnosed with musculoskeletal pain.  She thinks this is the same.  Patient also with CT scan in March for right-sided abdominal pain that was also unremarkable.  At this time, patient would like to defer any imaging studies and would want supportive care instructions.  Discussed possibility  of potential kidney stones versus diverticulitis or other intra-abdominal process, but patient states she would like to try supportive care management first and return if worsening or new symptoms. I think this is reasonable, given similar presentations before with negative work-up.   Strict return and follow-up instructions reviewed. She expressed understanding of all discharge instructions and felt comfortable with the plan of care.   Final Clinical Impressions(s) / ED Diagnoses   Final diagnoses:  Left lower quadrant abdominal pain of unknown etiology    ED Discharge Orders    None       Forde Dandy, MD 04/04/17 [phone]

## 2017-04-04 NOTE — ED Triage Notes (Signed)
L side abd pain and L leg pain for a couple of days.

## 2017-04-04 NOTE — ED Notes (Signed)
ED Provider at bedside. 

## 2017-04-04 NOTE — Discharge Instructions (Signed)
Your blood work and urine overall are reassuring. You do have anemia likely related to your heavy menstruation. [facility] provided if you would like to talk to gynecologist regarding this issue.   Please return without fail for worsening symptoms, including fever, escalating abdominal pain, intractable vomiting, or any other symptoms concerning to you.

## 2018-06-02 ENCOUNTER — Other Ambulatory Visit: Payer: Self-pay

## 2018-06-02 ENCOUNTER — Emergency Department (HOSPITAL_BASED_OUTPATIENT_CLINIC_OR_DEPARTMENT_OTHER)
Admission: EM | Admit: 2018-06-02 | Discharge: 2018-06-02 | Disposition: A | Discharge status: Home / Self Care | Payer: Self-pay | Attending: Emergency Medicine | Admitting: Emergency Medicine

## 2018-06-02 ENCOUNTER — Encounter (HOSPITAL_BASED_OUTPATIENT_CLINIC_OR_DEPARTMENT_OTHER): Payer: Self-pay

## 2018-06-02 ENCOUNTER — Emergency Department (HOSPITAL_BASED_OUTPATIENT_CLINIC_OR_DEPARTMENT_OTHER): Payer: Self-pay

## 2018-06-02 DIAGNOSIS — J45909 Unspecified asthma, uncomplicated: Secondary | ICD-10-CM | POA: Insufficient documentation

## 2018-06-02 DIAGNOSIS — D72819 Decreased white blood cell count, unspecified: Secondary | ICD-10-CM | POA: Insufficient documentation

## 2018-06-02 DIAGNOSIS — Z79899 Other long term (current) drug therapy: Secondary | ICD-10-CM | POA: Insufficient documentation

## 2018-06-02 DIAGNOSIS — R2243 Localized swelling, mass and lump, lower limb, bilateral: Secondary | ICD-10-CM | POA: Insufficient documentation

## 2018-06-02 DIAGNOSIS — M25472 Effusion, left ankle: Secondary | ICD-10-CM

## 2018-06-02 DIAGNOSIS — M25473 Effusion, unspecified ankle: Secondary | ICD-10-CM

## 2018-06-02 DIAGNOSIS — M25476 Effusion, unspecified foot: Secondary | ICD-10-CM

## 2018-06-02 DIAGNOSIS — R079 Chest pain, unspecified: Secondary | ICD-10-CM | POA: Insufficient documentation

## 2018-06-02 DIAGNOSIS — D509 Iron deficiency anemia, unspecified: Secondary | ICD-10-CM | POA: Insufficient documentation

## 2018-06-02 LAB — CBC
HCT: 24.2 % — ABNORMAL LOW (ref 36.0–46.0)
Hemoglobin: 6.4 g/dL — CL (ref 12.0–15.0)
MCH: 16.8 pg — ABNORMAL LOW (ref 26.0–34.0)
MCHC: 26.4 g/dL — ABNORMAL LOW (ref 30.0–36.0)
MCV: 63.4 fL — ABNORMAL LOW (ref 80.0–100.0)
NRBC: 0 % (ref 0.0–0.2)
PLATELETS: 308 10*3/uL (ref 150–400)
RBC: 3.82 MIL/uL — ABNORMAL LOW (ref 3.87–5.11)
RDW: 19 % — AB (ref 11.5–15.5)
WBC: 2.4 10*3/uL — AB (ref 4.0–10.5)

## 2018-06-02 LAB — COMPREHENSIVE METABOLIC PANEL
ALBUMIN: 3.8 g/dL (ref 3.5–5.0)
ALT: 10 U/L (ref 0–44)
AST: 19 U/L (ref 15–41)
Alkaline Phosphatase: 53 U/L (ref 38–126)
Anion gap: 6 (ref 5–15)
BUN: 12 mg/dL (ref 6–20)
CHLORIDE: 107 mmol/L (ref 98–111)
CO2: 22 mmol/L (ref 22–32)
Calcium: 8.4 mg/dL — ABNORMAL LOW (ref 8.9–10.3)
Creatinine, Ser: 0.48 mg/dL (ref 0.44–1.00)
GFR calc Af Amer: >60 mL/min (ref >60)
GLUCOSE: 101 mg/dL — AB (ref 70–99)
POTASSIUM: 3.6 mmol/L (ref 3.5–5.1)
SODIUM: 135 mmol/L (ref 135–145)
Total Bilirubin: 0.7 mg/dL (ref 0.3–1.2)
Total Protein: 7.1 g/dL (ref 6.5–8.1)

## 2018-06-02 LAB — TROPONIN I

## 2018-06-02 LAB — PREGNANCY, URINE: PREG TEST UR: NEGATIVE

## 2018-06-02 MED ORDER — FERROUS SULFATE 325 (65 FE) MG PO TABS: 325.0000 mg | ORAL_TABLET | Freq: Every day | ORAL | 0 refills | Status: AC

## 2018-06-02 NOTE — ED Notes (Signed)
ED Provider at bedside. 

## 2018-06-02 NOTE — ED Triage Notes (Signed)
C/o CP and swelling to bilat ankle started last night-NAD-steady gait

## 2018-06-02 NOTE — Discharge Instructions (Addendum)
`   You were seen in the emergency department today for ankle swelling and chest pain.  Your work-up in the emergency department showed that you are very anemic with a hemoglobin of 6.4.  We are starting you on an iron supplement, it is essential that you take this daily, and have your hemoglobin rechecked by primary care within the next 2 to 3 days.  Your white blood cell count was also low at 2.3, have this rechecked by primary care as well.  X-ray was normal.  Your ultrasound did not show blood clot.  Please follow-up closely with primary care, if you do not your primary care provider please see her [facility] or call the phone number circled in your discharge instructions.  Return to the ER for new or worsening symptoms including but not limited to lightheadedness, dizziness, or passing out.  Or any other concerns.

## 2018-06-02 NOTE — ED Notes (Addendum)
Pt states she would like to be discharged because she has somewhere to be. EDP made aware.

## 2018-06-02 NOTE — ED Notes (Signed)
Pt verbalized understanding of dc instructions.

## 2018-06-02 NOTE — ED Provider Notes (Signed)
[facility] EMERGENCY DEPARTMENT Provider Note   CSN: [id number] Arrival date & time: 06/02/18  1135    History   Chief Complaint Chief Complaint  Patient presents with  . Chest Pain  . Joint Swelling    HPI Marilyn Rodgers is a 38 y.o. female with a hx of GERD, anemia, asthma, and hidradenitis suppurativa who presents to the ED with complaints of intermittent chest pain and lower extremity swelling for years. Patient states she has issues with intermittent chest pain- R sided, moderate in severity, no specific alleviating/aggravating factors & is associated w/ dyspnea. Pain is not exertional or pleuritic. She states she is more so here today for the lower extremity swelling as opposed to the chest pain. She states her ankles are always somewhat swollen, recently L>R and seems worse than usual. She has started to get some pains in the L calf with this over the past week or so. Denies fever chills, erythema, numbness, weakness, lightheadedness,dizziness, syncope, hemoptysis, recent surgery/trauma, recent long travel, hormone use, personal hx of cancer, or hx of DVT/PE. Denies early family hx of CAD. Has been seen in ER for same before. No recent traumatic injuries.      HPI  Past Medical History:  Diagnosis Date  . Anemia   . Asthma   . GERD (gastroesophageal reflux disease)   . Shortness of breath   . Umbilical hernia     Patient Active Problem List   Diagnosis Date Noted  . Burn 01/01/2012  . CHEST PAIN 12/14/2009  . DENTAL PAIN 11/06/2009  . HEADACHE 11/06/2009  . ABDOMINAL PAIN 02/19/2009  . 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 BREAST TENDERNESS 10/06/2008  . TIREDNESS 10/06/2008  . COSTOCHONDRITIS 09/07/2007  . ANEMIA 05/05/2007  . HERNIA 05/05/2007  . DIARRHEA, RECURRENT 05/05/2007  . GERD 12/17/2006  . HIDRADENITIS SUPPURATIVA 08/03/2003    Past Surgical History:  Procedure Laterality Date  . NO PAST SURGERIES       OB History    Gravida  0   Para      Term      Preterm      AB      Living        SAB      TAB      Ectopic      Multiple      Live Births               Home Medications    Prior to Admission medications   Medication Sig Start Date End Date Taking? Authorizing Provider  albuterol (PROVENTIL HFA;VENTOLIN HFA) 108 (90 BASE) MCG/ACT inhaler Inhale 2 puffs into the lungs every 4 (four) hours as needed for wheezing. 08/18/11 08/17/12  Linton Flemings, MD  albuterol (PROVENTIL HFA;VENTOLIN HFA) 108 (90 BASE) MCG/ACT inhaler Inhale 2 puffs into the lungs every 4 (four) hours as needed for wheezing. 10/10/11 10/09/12  Ignacia Felling, PA-C  bacitracin ointment Apply topically 2 (two) times daily. Apply to face twice daily. 01/01/12   Pamella Pert, MD  budesonide-formoterol [facility]) 160-4.5 MCG/ACT inhaler Inhale 2 puffs into the lungs daily. 12/28/13   Waldemar Dickens, MD  diclofenac (VOLTAREN) 75 MG EC tablet Take 1 tablet (75 mg total) by mouth 2 (two) times daily. 12/28/13   Waldemar Dickens, MD  dicyclomine (BENTYL) 20 MG tablet Take 1 tablet (20 mg total) by mouth 2 (two) times daily as needed for spasms. 06/25/16   Julianne Rice, MD  docusate sodium (COLACE) 100 MG capsule  Take 1 capsule (100 mg total) by mouth every 12 (twelve) hours. 06/25/16   Julianne Rice, MD  ferrous sulfate 325 (65 FE) MG tablet Take 1 tablet (325 mg total) by mouth daily. 06/25/16   Julianne Rice, MD  fluticasone (FLONASE) 50 MCG/ACT nasal spray Place 1-2 sprays into both nostrils daily. 12/28/13   Waldemar Dickens, MD  HYDROcodone-acetaminophen (NORCO/VICODIN) 5-325 MG tablet Take 1 tablet by mouth every 6 (six) hours as needed for moderate pain or severe pain. 04/04/17   Forde Dandy, MD  methylPREDNISolone (MEDROL DOSEPAK) 4 MG TBPK tablet Take as directed on box 12/02/16   Duffy Bruce, MD  naproxen (NAPROSYN) 500 MG tablet Take 1 tablet (500 mg total) by mouth 2 (two) times daily. 04/04/17   Forde Dandy, MD  oxyCODONE-acetaminophen  (PERCOCET) 5-325 MG per tablet Take 1 tablet by mouth every 6 (six) hours as needed for pain. 01/01/12   Pamella Pert, MD  silver sulfADIAZINE (SILVADENE) 1 % cream Apply topically daily. Apply to arm, do not apply to face. 01/01/12   Pamella Pert, MD  traMADol (ULTRAM) 50 MG tablet Take 1 tablet (50 mg total) by mouth every 6 (six) hours as needed. 08/04/15   Leonard Schwartz, MD    Family History Family History  Problem Relation Age of Onset  . Diabetes Maternal Aunt   . Heart disease Maternal Aunt   . Hypertension Maternal Aunt   . Cancer Paternal Aunt   . Arthritis Paternal Grandmother   . Diabetes Paternal Grandmother     Social History Social History   Tobacco Use  . Smoking status: Never Smoker  . Smokeless tobacco: Never Used  Substance Use Topics  . Alcohol use: Yes    Comment: occ  . Drug use: No     Allergies   Other   Review of Systems Review of Systems  Constitutional: Negative for chills, diaphoresis, fever and unexpected weight change.  HENT: Negative for congestion and ear pain.   Respiratory: Positive for shortness of breath. Negative for cough.   Cardiovascular: Positive for chest pain and leg swelling. Negative for palpitations.  Gastrointestinal: Negative for abdominal pain, blood in stool, constipation, diarrhea, nausea and vomiting.  Genitourinary: Negative for dysuria.  Skin: Negative for color change and wound.  Neurological: Negative for dizziness, syncope and light-headedness.  All other systems reviewed and are negative.  Physical Exam Updated Vital Signs BP 116/83 (BP Location: Left Arm)   Pulse 87   Temp 98.3 F (36.8 C) (Oral)   Resp 16   Ht 5\' 6"  (1.676 m)   Wt 58.6 kg   LMP 05/29/2018   SpO2 100%   BMI 20.85 kg/m   Physical Exam Vitals signs and nursing note reviewed.  Constitutional:      General: She is not in acute distress.    Appearance: She is well-developed. She is not toxic-appearing.  HENT:     Head:  Normocephalic and atraumatic.  Eyes:     General:        Right eye: No discharge.        Left eye: No discharge.     Conjunctiva/sclera: Conjunctivae normal.  Neck:     Musculoskeletal: Neck supple.  Cardiovascular:     Rate and Rhythm: Normal rate and regular rhythm.     Pulses:          Radial pulses are 2+ on the right side and 2+ on the left side.       Dorsalis  pedis pulses are 2+ on the right side and 2+ on the left side.       Posterior tibial pulses are 2+ on the right side and 2+ on the left side.     Heart sounds: Normal heart sounds.  Pulmonary:     Effort: Pulmonary effort is normal. No respiratory distress.     Breath sounds: Normal breath sounds. No wheezing, rhonchi or rales.  Chest:     Chest wall: No tenderness.  Abdominal:     General: There is no distension.     Palpations: Abdomen is soft.     Tenderness: There is no abdominal tenderness.  Musculoskeletal:     Comments: Lower Extremities: Patient has trace pitting edema noted to the distal left lower leg.  Bilateral ankles appear mildly swollen.  No overlying erythema or warmth.  No open wounds.  No notable abscess.  She has intact active range of motion to bilateral hips, knees, ankles, and all digits.  She is mildly tender over the left lower leg without point/focal bony tenderness.  She is neurovascularly intact distally.  Skin:    General: Skin is warm and dry.     Findings: No rash.  Neurological:     Mental Status: She is alert.     Comments: Clear speech.  Sensation grossly intact bilateral lower extremities.  5 out of 5 symmetric strength with plantar and dorsiflexion bilaterally.  Patient is ambulatory.  Psychiatric:        Behavior: Behavior normal.      ED Treatments / Results  Labs (all labs ordered are listed, but only abnormal results are displayed) Labs Reviewed  CBC - Abnormal; Notable for the following components:      Result Value   WBC 2.4 (*)    RBC 3.82 (*)    Hemoglobin 6.4 (*)      HCT 24.2 (*)    MCV 63.4 (*)    MCH 16.8 (*)    MCHC 26.4 (*)    RDW 19.0 (*)    All other components within normal limits  COMPREHENSIVE METABOLIC PANEL - Abnormal; Notable for the following components:   Glucose, Bld 101 (*)    Calcium 8.4 (*)    All other components within normal limits  PREGNANCY, URINE  TROPONIN I    EKG EKG Interpretation  Date/Time:  Wednesday June 02 2018 11:44:06 EST Ventricular Rate:  86 PR Interval:  148 QRS Duration: 80 QT Interval:  342 QTC Calculation: 409 R Axis:   84 Text Interpretation:  Normal sinus rhythm Possible Anterior infarct , age undetermined Abnormal ECG No significant change since last tracing Confirmed by Davonna Belling [phone]) on 06/02/2018 12:09:25 PM Also confirmed by Davonna Belling [phone]), editor Philomena Doheny [phone])  on 06/02/2018 12:46:09 PM   Radiology Dg Chest 2 View  Result Date: 06/02/2018 CLINICAL DATA:  38 year old female with a history of right-sided chest pain EXAM: CHEST - 2 VIEW COMPARISON:  Multiple prior most recent 02/08/2008 FINDINGS: The heart size and mediastinal contours are within normal limits. Both lungs are clear. The visualized skeletal structures are unremarkable. IMPRESSION: Negative for acute cardiopulmonary disease Electronically Signed   By: Corrie Mckusick D.O.   On: 06/02/2018 13:08   US Venous Img Lower  Left (dvt Study)  Result Date: 06/02/2018 CLINICAL DATA:  38 year old female with edema EXAM: LEFT LOWER EXTREMITY VENOUS DOPPLER ULTRASOUND TECHNIQUE: Gray-scale sonography with graded compression, as well as color Doppler and duplex ultrasound were performed to evaluate the  lower extremity deep venous systems from the level of the common femoral vein and including the common femoral, femoral, profunda femoral, popliteal and calf veins including the posterior tibial, peroneal and gastrocnemius veins when visible. The superficial great saphenous vein was also interrogated. Spectral Doppler  was utilized to evaluate flow at rest and with distal augmentation maneuvers in the common femoral, femoral and popliteal veins. COMPARISON:  None. FINDINGS: Contralateral Common Femoral Vein: Respiratory phasicity is normal and symmetric with the symptomatic side. No evidence of thrombus. Normal compressibility. Common Femoral Vein: No evidence of thrombus. Normal compressibility, respiratory phasicity and response to augmentation. Saphenofemoral Junction: No evidence of thrombus. Normal compressibility and flow on color Doppler imaging. Profunda Femoral Vein: No evidence of thrombus. Normal compressibility and flow on color Doppler imaging. Femoral Vein: No evidence of thrombus. Normal compressibility, respiratory phasicity and response to augmentation. Popliteal Vein: No evidence of thrombus. Normal compressibility, respiratory phasicity and response to augmentation. Calf Veins: No evidence of thrombus. Normal compressibility and flow on color Doppler imaging. Superficial Great Saphenous Vein: No evidence of thrombus. Normal compressibility and flow on color Doppler imaging. Other Findings:  Mild edema IMPRESSION: Sonographic survey of the left lower extremity negative for DVT Electronically Signed   By: Corrie Mckusick D.O.   On: 06/02/2018 12:46    Procedures Procedures (including critical care time)  Medications Ordered in ED Medications - No data to display   Initial Impression / Assessment and Plan / ED Course  I have reviewed the triage vital signs and the nursing notes.  Pertinent labs & imaging results that were available during my care of the patient were reviewed by me and considered in my medical decision making (see chart for details).   Patient presents to the ED with complaints of bilateral ankle swelling & intermittent chest pain/dyspnea, sxs have been intermittent for > 1 year. Patient is nontoxic appearing, in no apparent distress, vitals without significant abnormality. She has some  mild swelling noted to bilateral ankles- no overlying erythema/warmth to suggest infectious process. Swelling extends to L lower leg w/ some mild calf tenderness, soft compartments, no point/focal bony tenderness or injury to suggest fx/dislocation, no erythema/warmth to suggest infectious process. Heart & lung exam WNL. Will evaluate w/ basic labs, troponin, EKG, CXR, & LLE venous duplex.   Work-up reviewed:  CBC: Anemia w/ hgb of 6.4- consistent with microcytic hypochromic anemia. Leukopenia at 2.4- down-trending from prior on record.  CMP: Mild hypocalcemia @ 8.4, no other electrolyte disturbance. Renal function & LFTs WNL.  Preg test: Negative Troponin: Negative EKG: NO significant change from prior LLE venous duplex: negative for DVT CXR: Negative for infiltrate, pneumothorax, effusion, or edema.   W/ negative DVT study, low risk wells for PE, doubt pulmonary embolism especially in setting of intermittent sxs for > 1 year. Patient with low risk heart score, initial trop negative, EKG without STEMI or significant change from prior- doubt ACS, however patient unwilling to remain in department for delta troponin, she understands the risks including NSTEMI & death and is requesting discharge, oriented w/ decision making capacity. No widened mediastinum, symmetric distal pulses, doubt dissection. Her hgb is low at 6.4, she has a hx of iron deficiency anemia- further discussed with the patient- she has very heavy periods and has not taken her iron supplement for several months now, she has not had any lightheadedness, dizziness, or syncope. She states her intermittent chest pain/dyspnea is actually improved from prior episodes, does not seem to be symptomatic anemia.  No hypotension or tachycardia. Discussed with Dr. Alvino Chapel instructs does not seem to require transfusion at this time, start iron supplement, I am in agreement. Will discharge home with iron supplement and close follow up for her sxs,  anemia, as well as need for recheck of her leukopenia. I discussed results, treatment plan, need for follow-up, and return precautions with the patient. Provided opportunity for questions, patient confirmed understanding and is in agreement with plan.    Final Clinical Impressions(s) / ED Diagnoses   Final diagnoses:  Chest pain, unspecified type  Leukopenia, unspecified type  Iron deficiency anemia, unspecified iron deficiency anemia type  Bilateral swelling of feet and ankles    ED Discharge Orders         Ordered    ferrous sulfate 325 (65 FE) MG tablet  Daily     06/02/18 [address], [place], PA-C 06/02/18 1944    Davonna Belling, MD 06/03/18 [phone]

## 2018-06-22 ENCOUNTER — Emergency Department (HOSPITAL_BASED_OUTPATIENT_CLINIC_OR_DEPARTMENT_OTHER): Payer: Self-pay

## 2018-06-22 ENCOUNTER — Encounter (HOSPITAL_BASED_OUTPATIENT_CLINIC_OR_DEPARTMENT_OTHER): Payer: Self-pay | Admitting: Emergency Medicine

## 2018-06-22 ENCOUNTER — Other Ambulatory Visit: Payer: Self-pay

## 2018-06-22 ENCOUNTER — Emergency Department (HOSPITAL_BASED_OUTPATIENT_CLINIC_OR_DEPARTMENT_OTHER)
Admission: EM | Admit: 2018-06-22 | Discharge: 2018-06-22 | Disposition: A | Discharge status: Home / Self Care | Payer: Self-pay | Attending: Emergency Medicine | Admitting: Emergency Medicine

## 2018-06-22 DIAGNOSIS — J45909 Unspecified asthma, uncomplicated: Secondary | ICD-10-CM | POA: Insufficient documentation

## 2018-06-22 DIAGNOSIS — R002 Palpitations: Secondary | ICD-10-CM | POA: Insufficient documentation

## 2018-06-22 DIAGNOSIS — R2243 Localized swelling, mass and lump, lower limb, bilateral: Secondary | ICD-10-CM | POA: Insufficient documentation

## 2018-06-22 DIAGNOSIS — R609 Edema, unspecified: Secondary | ICD-10-CM

## 2018-06-22 DIAGNOSIS — Z79899 Other long term (current) drug therapy: Secondary | ICD-10-CM | POA: Insufficient documentation

## 2018-06-22 DIAGNOSIS — D649 Anemia, unspecified: Secondary | ICD-10-CM | POA: Insufficient documentation

## 2018-06-22 LAB — CBC
HEMATOCRIT: 26.6 % — AB (ref 36.0–46.0)
HEMOGLOBIN: 7.1 g/dL — AB (ref 12.0–15.0)
MCH: 16.8 pg — AB (ref 26.0–34.0)
MCHC: 26.7 g/dL — ABNORMAL LOW (ref 30.0–36.0)
MCV: 62.9 fL — AB (ref 80.0–100.0)
NRBC: 0 % (ref 0.0–0.2)
Platelets: 299 10*3/uL (ref 150–400)
RBC: 4.23 MIL/uL (ref 3.87–5.11)
RDW: 19.2 % — ABNORMAL HIGH (ref 11.5–15.5)
WBC: 3.3 10*3/uL — ABNORMAL LOW (ref 4.0–10.5)

## 2018-06-22 LAB — URINALYSIS, ROUTINE W REFLEX MICROSCOPIC
Bilirubin Urine: NEGATIVE
GLUCOSE, UA: NEGATIVE mg/dL
KETONES UR: NEGATIVE mg/dL
Leukocytes,Ua: NEGATIVE
Nitrite: NEGATIVE
PH: 5.5 (ref 5.0–8.0)
Protein, ur: NEGATIVE mg/dL
Specific Gravity, Urine: 1.015 (ref 1.005–1.030)

## 2018-06-22 LAB — COMPREHENSIVE METABOLIC PANEL
ALBUMIN: 4.1 g/dL (ref 3.5–5.0)
ALK PHOS: 53 U/L (ref 38–126)
ALT: 20 U/L (ref 0–44)
AST: 25 U/L (ref 15–41)
Anion gap: 4 — ABNORMAL LOW (ref 5–15)
BUN: 5 mg/dL — ABNORMAL LOW (ref 6–20)
CALCIUM: 8.8 mg/dL — AB (ref 8.9–10.3)
CO2: 24 mmol/L (ref 22–32)
CREATININE: 0.68 mg/dL (ref 0.44–1.00)
Chloride: 105 mmol/L (ref 98–111)
GFR calc Af Amer: >60 mL/min (ref >60)
GFR calc non Af Amer: >60 mL/min (ref >60)
GLUCOSE: 92 mg/dL (ref 70–99)
Potassium: 3.4 mmol/L — ABNORMAL LOW (ref 3.5–5.1)
Sodium: 133 mmol/L — ABNORMAL LOW (ref 135–145)
Total Bilirubin: 0.9 mg/dL (ref 0.3–1.2)
Total Protein: 7.4 g/dL (ref 6.5–8.1)

## 2018-06-22 LAB — URINALYSIS, MICROSCOPIC (REFLEX)

## 2018-06-22 LAB — TSH: TSH: 1.691 u[IU]/mL (ref 0.350–4.500)

## 2018-06-22 LAB — PREGNANCY, URINE: Preg Test, Ur: NEGATIVE

## 2018-06-22 LAB — TROPONIN I: Troponin I: <0.03 ng/mL (ref <0.03)

## 2018-06-22 MED ORDER — FUROSEMIDE 20 MG PO TABS: 20.0000 mg | ORAL_TABLET | Freq: Every day | ORAL | 0 refills | Status: AC

## 2018-06-22 NOTE — Discharge Instructions (Addendum)
You were seen in the emergency department for worsening swelling in your ankles.  We rechecked some blood work and you are still anemic although slightly better than few weeks ago.  You should continue to take an iron supplement.  We are putting you on a fluid pill to try to pull off some of the extra fluid.  We also sent a test off for lupus and your thyroid and these will need to be followed up.  It will be important for you to establish care with a primary care doctor.  Return if any concerns.

## 2018-06-22 NOTE — ED Triage Notes (Signed)
Ankle swelling for months.  Sts she was seen here for it before but it is no better.  Also sts she has been feeling palpitations... "Like my heart is beating out of my chest, " for 1 week.

## 2018-06-22 NOTE — ED Provider Notes (Signed)
[name] Provider Note   CSN: [id number] Arrival date & time: 06/22/18  1037    History   Chief Complaint Chief Complaint  Patient presents with  . Ankle swelling & palpitations    HPI Marilyn Rodgers is a 38 y.o. female.  She is presenting with bilateral ankle swelling that is been going on for over a month.  She is also been feeling her heart beating strongly in her chest.  She said her ankles are painful to walk on.  No injury.  No shortness of breath.  Dry cough.  No fevers chills weight loss nausea vomiting diarrhea or urinary symptoms.  Last menstrual period 1 month ago.  She said she was here a few weeks ago for same and they did lab work and told her was abnormal.  She has chronic anemia.  No other swollen joints no rashes.     The history is provided by the patient.  Ankle Pain  Location:  Ankle Ankle location:  L ankle and R ankle Pain details:    Quality:  Aching   Radiates to:  Does not radiate   Severity:  Moderate   Onset quality:  Gradual   Duration:  1 month   Timing:  Constant   Progression:  Unchanged Chronicity:  New Prior injury to area:  No Relieved by:  None tried Worsened by:  Bearing weight Ineffective treatments:  None tried Associated symptoms: decreased ROM, stiffness and swelling   Associated symptoms: no back pain, no fever, no neck pain and no tingling     Past Medical History:  Diagnosis Date  . Anemia   . Asthma   . GERD (gastroesophageal reflux disease)   . Shortness of breath   . Umbilical hernia     Patient Active Problem List   Diagnosis Date Noted  . Burn 01/01/2012  . CHEST PAIN 12/14/2009  . DENTAL PAIN 11/06/2009  . HEADACHE 11/06/2009  . ABDOMINAL PAIN 02/19/2009  . 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 BREAST TENDERNESS 10/06/2008  . TIREDNESS 10/06/2008  . COSTOCHONDRITIS 09/07/2007  . ANEMIA 05/05/2007  . HERNIA 05/05/2007  . DIARRHEA, RECURRENT 05/05/2007  . GERD 12/17/2006  . HIDRADENITIS  SUPPURATIVA 08/03/2003    Past Surgical History:  Procedure Laterality Date  . NO PAST SURGERIES       OB History    Gravida  0   Para      Term      Preterm      AB      Living        SAB      TAB      Ectopic      Multiple      Live Births               Home Medications    Prior to Admission medications   Medication Sig Start Date End Date Taking? Authorizing Provider  albuterol (PROVENTIL HFA;VENTOLIN HFA) 108 (90 BASE) MCG/ACT inhaler Inhale 2 puffs into the lungs every 4 (four) hours as needed for wheezing. 08/18/11 08/17/12  Linton Flemings, MD  albuterol (PROVENTIL HFA;VENTOLIN HFA) 108 (90 BASE) MCG/ACT inhaler Inhale 2 puffs into the lungs every 4 (four) hours as needed for wheezing. 10/10/11 10/09/12  Ignacia Felling, PA-C  bacitracin ointment Apply topically 2 (two) times daily. Apply to face twice daily. 01/01/12   Pamella Pert, MD  budesonide-formoterol [facility]) 160-4.5 MCG/ACT inhaler Inhale 2 puffs into the lungs daily. 12/28/13   Merrell,  Grayling Congress, MD  diclofenac (VOLTAREN) 75 MG EC tablet Take 1 tablet (75 mg total) by mouth 2 (two) times daily. 12/28/13   Waldemar Dickens, MD  dicyclomine (BENTYL) 20 MG tablet Take 1 tablet (20 mg total) by mouth 2 (two) times daily as needed for spasms. 06/25/16   Julianne Rice, MD  docusate sodium (COLACE) 100 MG capsule Take 1 capsule (100 mg total) by mouth every 12 (twelve) hours. 06/25/16   Julianne Rice, MD  ferrous sulfate 325 (65 FE) MG tablet Take 1 tablet (325 mg total) by mouth daily. 06/02/18   Petrucelli, Samantha R, PA-C  fluticasone (FLONASE) 50 MCG/ACT nasal spray Place 1-2 sprays into both nostrils daily. 12/28/13   Waldemar Dickens, MD  HYDROcodone-acetaminophen (NORCO/VICODIN) 5-325 MG tablet Take 1 tablet by mouth every 6 (six) hours as needed for moderate pain or severe pain. 04/04/17   Forde Dandy, MD  methylPREDNISolone (MEDROL DOSEPAK) 4 MG TBPK tablet Take as directed on box 12/02/16    Duffy Bruce, MD  naproxen (NAPROSYN) 500 MG tablet Take 1 tablet (500 mg total) by mouth 2 (two) times daily. 04/04/17   Forde Dandy, MD  oxyCODONE-acetaminophen (PERCOCET) 5-325 MG per tablet Take 1 tablet by mouth every 6 (six) hours as needed for pain. 01/01/12   Pamella Pert, MD  silver sulfADIAZINE (SILVADENE) 1 % cream Apply topically daily. Apply to arm, do not apply to face. 01/01/12   Pamella Pert, MD  traMADol (ULTRAM) 50 MG tablet Take 1 tablet (50 mg total) by mouth every 6 (six) hours as needed. 08/04/15   Leonard Schwartz, MD    Family History Family History  Problem Relation Age of Onset  . Diabetes Maternal Aunt   . Heart disease Maternal Aunt   . Hypertension Maternal Aunt   . Cancer Paternal Aunt   . Arthritis Paternal Grandmother   . Diabetes Paternal Grandmother     Social History Social History   Tobacco Use  . Smoking status: Never Smoker  . Smokeless tobacco: Never Used  Substance Use Topics  . Alcohol use: Yes    Comment: occ  . Drug use: No     Allergies   Other   Review of Systems Review of Systems  Constitutional: Negative for fever.  HENT: Negative for sore throat.   Eyes: Negative for visual disturbance.  Respiratory: Positive for cough. Negative for shortness of breath.   Cardiovascular: Positive for palpitations. Negative for chest pain.  Gastrointestinal: Negative for abdominal pain.  Genitourinary: Negative for dysuria.  Musculoskeletal: Positive for joint swelling and stiffness. Negative for back pain and neck pain.  Skin: Negative for rash.  Neurological: Negative for headaches.     Physical Exam Updated Vital Signs BP 114/78 (BP Location: Left Arm)   Pulse 89   Temp 98.3 F (36.8 C) (Oral)   Resp 18   Ht 5\' 5"  (1.651 m)   Wt 59.6 kg   LMP 05/29/2018   SpO2 100%   BMI 21.88 kg/m   Physical Exam Vitals signs and nursing note reviewed.  Constitutional:      General: She is not in acute distress.     Appearance: She is well-developed.  HENT:     Head: Normocephalic and atraumatic.  Eyes:     Conjunctiva/sclera: Conjunctivae normal.  Neck:     Musculoskeletal: Neck supple.  Cardiovascular:     Rate and Rhythm: Normal rate and regular rhythm.     Heart sounds: No murmur.  Pulmonary:  Effort: Pulmonary effort is normal. No respiratory distress.     Breath sounds: Normal breath sounds.  Abdominal:     Palpations: Abdomen is soft.     Tenderness: There is no abdominal tenderness.  Musculoskeletal: Normal range of motion.        General: Swelling and tenderness present.     Comments: Patient has swelling of bilateral ankles.  There is no overlying erythema.  No warmth.  No calf cords or edema.  Skin:    General: Skin is warm and dry.     Capillary Refill: Capillary refill takes less than 2 seconds.  Neurological:     General: No focal deficit present.     Mental Status: She is alert and oriented to person, place, and time.     Motor: No weakness.     Gait: Gait normal.      ED Treatments / Results  Labs (all labs ordered are listed, but only abnormal results are displayed) Labs Reviewed  CBC - Abnormal; Notable for the following components:      Result Value   WBC 3.3 (*)    Hemoglobin 7.1 (*)    HCT 26.6 (*)    MCV 62.9 (*)    MCH 16.8 (*)    MCHC 26.7 (*)    RDW 19.2 (*)    All other components within normal limits  COMPREHENSIVE METABOLIC PANEL - Abnormal; Notable for the following components:   Sodium 133 (*)    Potassium 3.4 (*)    BUN 5 (*)    Calcium 8.8 (*)    Anion gap 4 (*)    All other components within normal limits  URINALYSIS, ROUTINE W REFLEX MICROSCOPIC - Abnormal; Notable for the following components:   Hgb urine dipstick TRACE (*)    All other components within normal limits  URINALYSIS, MICROSCOPIC (REFLEX) - Abnormal; Notable for the following components:   Bacteria, UA MANY (*)    All other components within normal limits  TROPONIN I    PREGNANCY, URINE  TSH  LUPUS ANTICOAGULANT PANEL  GC/CHLAMYDIA PROBE AMP ([place]) NOT AT [facility]    EKG EKG Interpretation  Date/Time:  Tuesday June 22 2018 11:27:08 EDT Ventricular Rate:  70 PR Interval:    QRS Duration: 88 QT Interval:  353 QTC Calculation: 381 R Axis:   59 Text Interpretation:  Sinus rhythm no acute st/ts similar to prior  Confirmed by Aletta Edouard [id number]) on 06/22/2018 11:38:18 AM Also confirmed by Aletta Edouard [phone]), editor Philomena Doheny [phone])  on 06/22/2018 12:01:21 PM   Radiology Dg Chest 2 View  Result Date: 06/22/2018 CLINICAL DATA:  Cardiac palpitations EXAM: CHEST - 2 VIEW COMPARISON:  June 02, 2018 FINDINGS: Lungs are clear. Heart size and pulmonary vascularity are normal. No adenopathy. No pneumothorax. No bone lesions. IMPRESSION: No edema or consolidation. Electronically Signed   By: Lowella Grip III M.D.   On: 06/22/2018 12:02    Procedures Procedures (including critical care time)  Medications Ordered in ED Medications - No data to display   Initial Impression / Assessment and Plan / ED Course  I have reviewed the triage vital signs and the nursing notes.  Pertinent labs & imaging results that were available during my care of the patient were reviewed by me and considered in my medical decision making (see chart for details).  Clinical Course as of Jun 21 1813  Tue Jun 22, 2018  1247 Patient's lab work redemonstrates a low hemoglobin but a  little bit better than a few weeks ago.  Rest of the work-up is been unremarkable including troponin and chemistries LFTs.  I also sent off a lupus screen as her sister has lupus.  This likely will not result soon and will need to be followed up by patient.   [MB]    Clinical Course User Index [MB] Hayden Rasmussen, MD        Final Clinical Impressions(s) / ED Diagnoses   Final diagnoses:  Peripheral edema  Anemia, unspecified type    ED Discharge Orders    None        Hayden Rasmussen, MD 06/22/18 1815

## 2018-06-23 LAB — LUPUS ANTICOAGULANT PANEL
DRVVT: 39.6 s (ref 0.0–47.0)
PTT Lupus Anticoagulant: 34.4 s (ref 0.0–51.9)

## 2018-07-10 IMAGING — CT CT RENAL STONE PROTOCOL
2 of 4 series · 15 of 46 positions shown, 17 images · non-contrast
Comparison: 06/24/2016

CLINICAL DATA: Low back and left lower quadrant pain for 1 week.

EXAM:
CT ABDOMEN AND PELVIS WITHOUT CONTRAST
TECHNIQUE: Multidetector CT imaging of the abdomen and pelvis was performed
following the standard protocol without IV contrast.

[Series 2: axial st · axial · 0.69mm/px · z∈[-446,-71]mm · 12 of 83 slices shown, 14 images]
[im 4/83  soft-tissue]
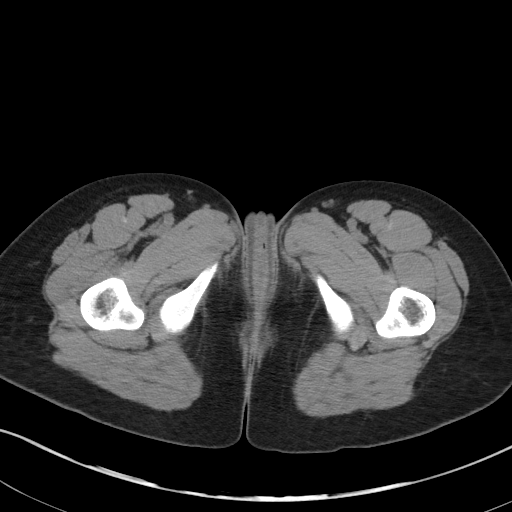
[im 4/83  bone]
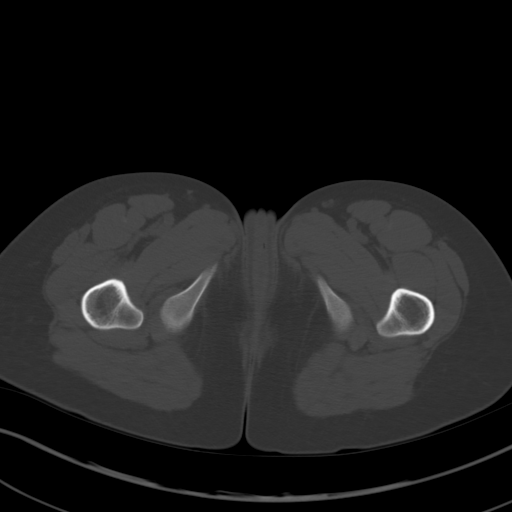
[im 11/83  soft-tissue]
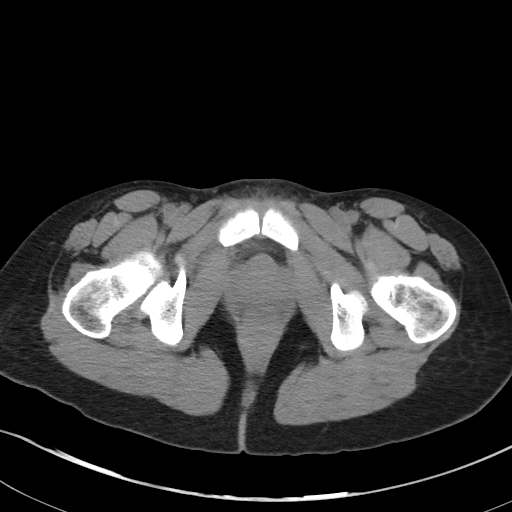
[im 18/83  soft-tissue]
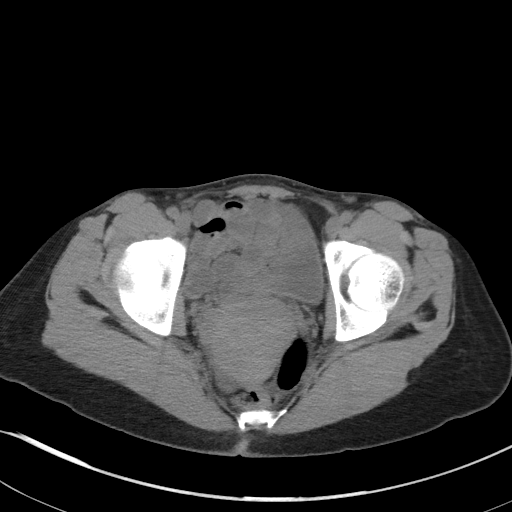
[im 24/83  soft-tissue]
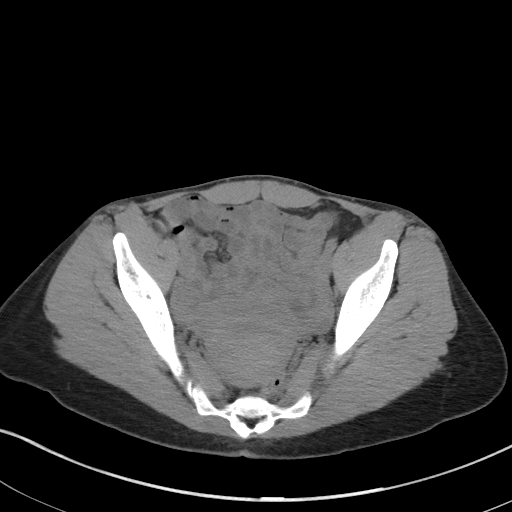
[im 31/83  soft-tissue]
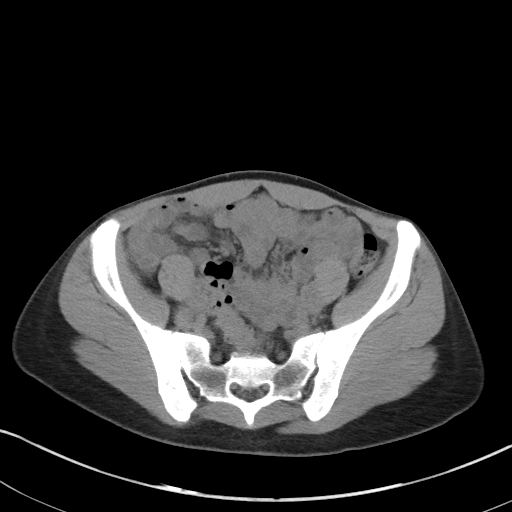
[im 38/83  soft-tissue]
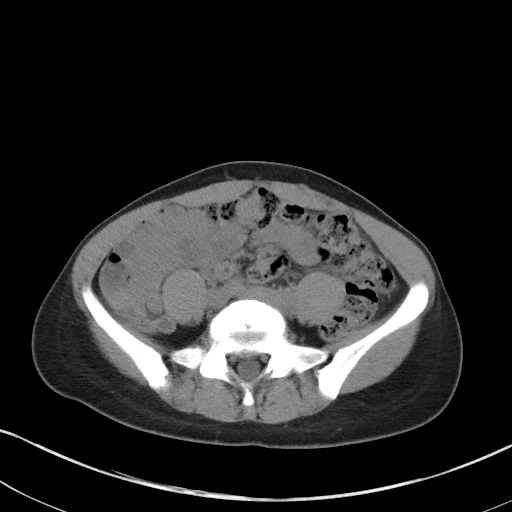
[im 45/83  soft-tissue]
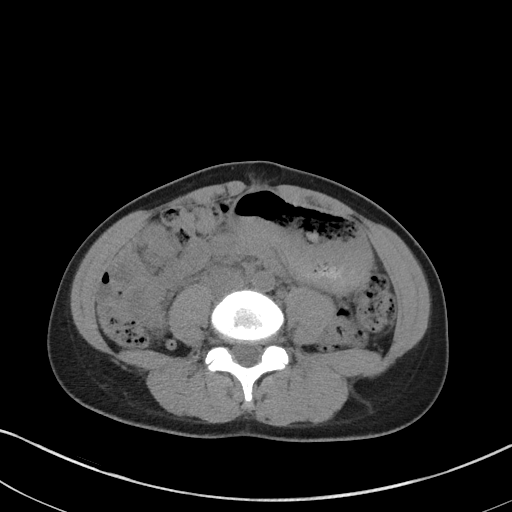
[im 52/83  soft-tissue]
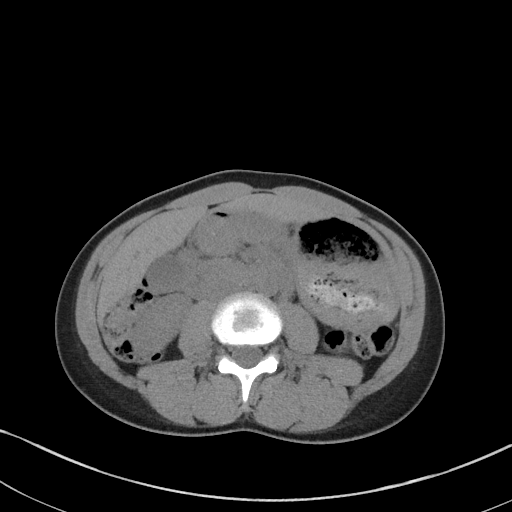
[im 59/83  soft-tissue]
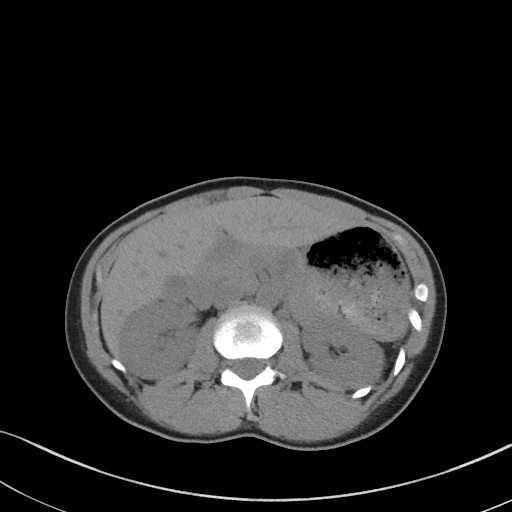
[im 59/83  bone]
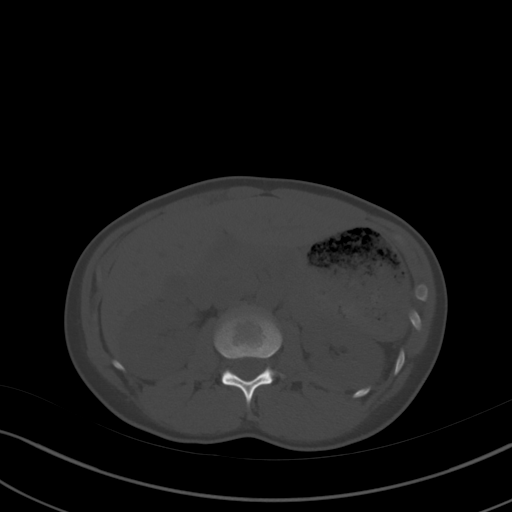
[im 65/83  soft-tissue]
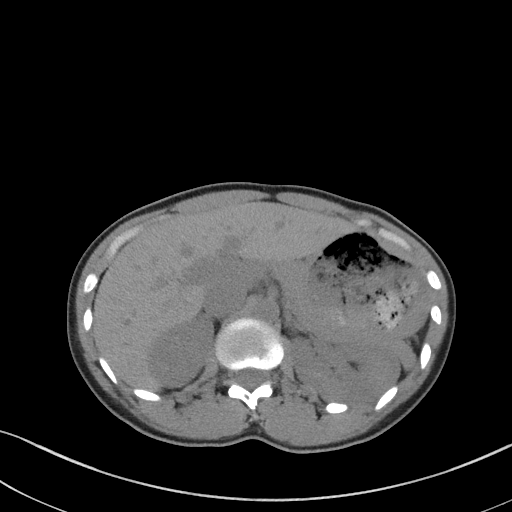
[im 72/83  soft-tissue]
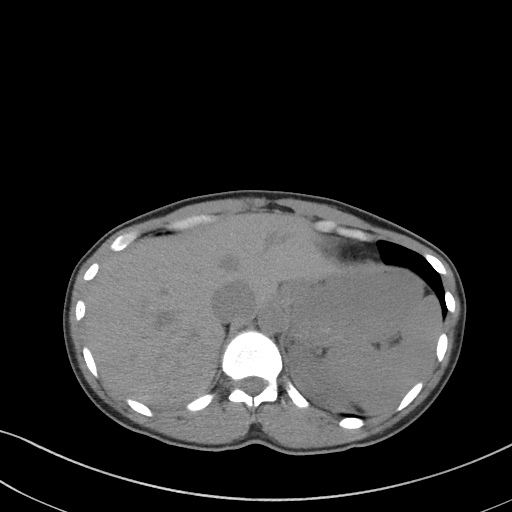
[im 79/83  soft-tissue]
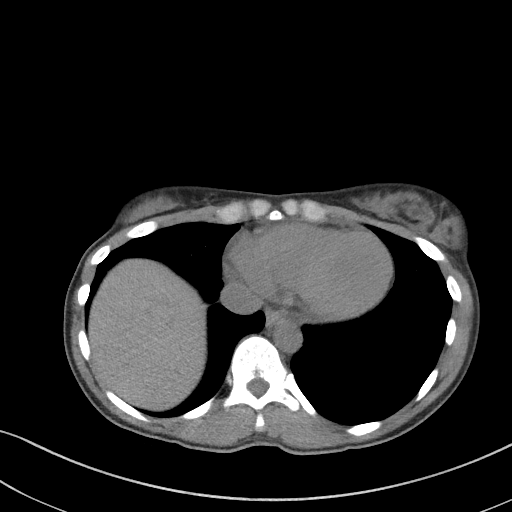

[Series 5: coronal st · coronal · 0.62mm/px · 3 of 71 slices shown]
[im 24/71  soft-tissue]
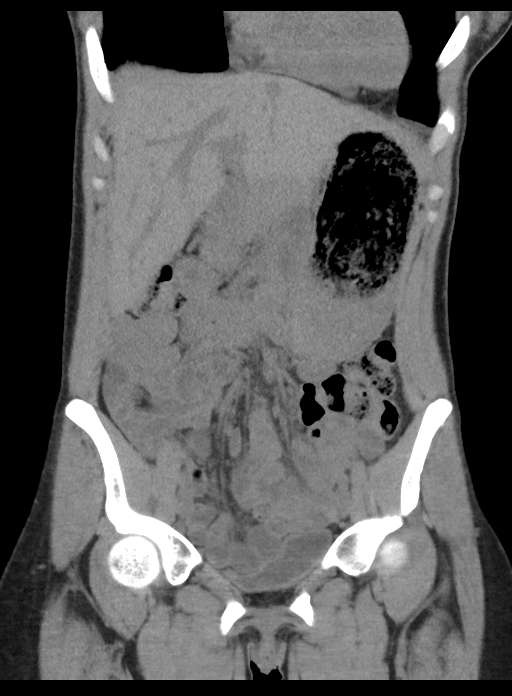
[im 32/71  soft-tissue]
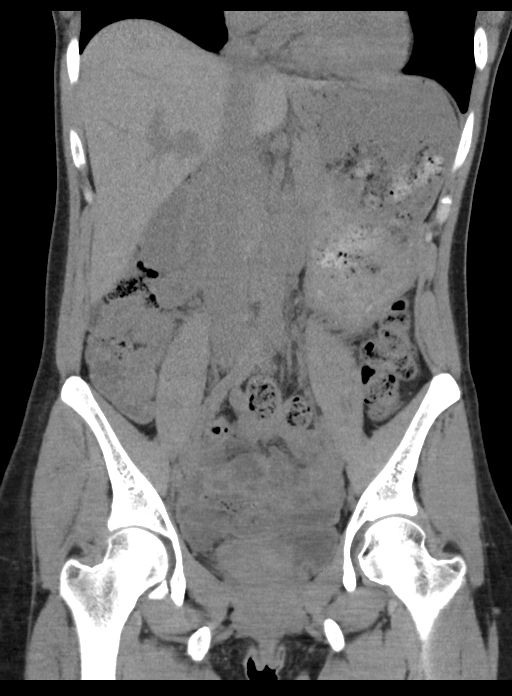
[im 39/71  soft-tissue]
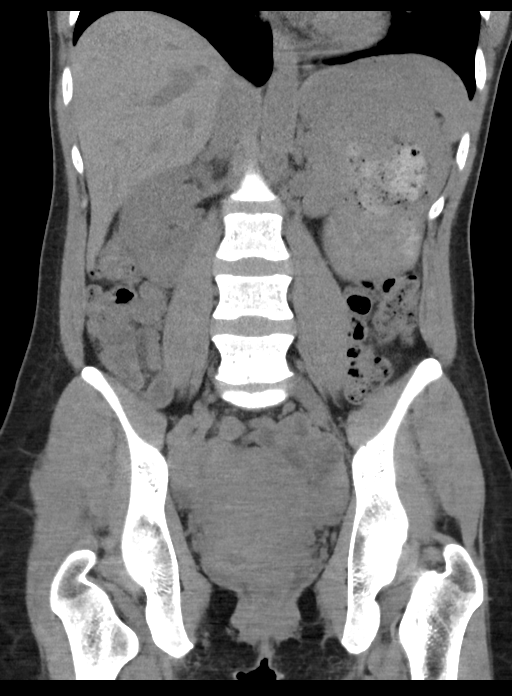

[15 of 46 positions shown; findings below may reference images not displayed]

FINDINGS: Lower chest:  Unremarkable.

Hepatobiliary: No focal abnormality in the liver on this study
without intravenous contrast. There is no evidence for gallstones,
gallbladder wall thickening, or pericholecystic fluid. No
intrahepatic or extrahepatic biliary dilation.

Pancreas: No focal mass lesion. No dilatation of the main duct. No
intraparenchymal cyst. No peripancreatic edema.

Spleen: No splenomegaly. No focal mass lesion.

Adrenals/Urinary Tract: No adrenal nodule or mass. Kidneys
unremarkable. Specifically, no renal stones. No evidence for
hydroureter. The urinary bladder appears normal for the degree of
distention.

Stomach/Bowel: Stomach is nondistended. No gastric wall thickening.
No evidence of outlet obstruction. Duodenum is normally positioned
as is the ligament of Treitz. No small bowel wall thickening. No
small bowel dilatation. Low dose pulsed flouro and image save
technique used. The terminal ileum is normal. The appendix is
normal. No gross colonic mass. No colonic wall thickening. No
substantial diverticular change.

Vascular/Lymphatic: No abdominal aortic aneurysm. There is no
gastrohepatic or hepatoduodenal ligament lymphadenopathy. No
intraperitoneal or retroperitoneal lymphadenopathy. No pelvic
sidewall lymphadenopathy.

Reproductive: Multiple uterine fibroids evident, better demonstrated
on the previous CT scan with contrast material. There is no adnexal
mass.

Other: Trace free fluid noted in the cul-de-sac. This can be
physiologic in a premenopausal female.

Musculoskeletal: Bone windows reveal no worrisome lytic or sclerotic
osseous lesions.
IMPRESSION: 1. Unremarkable CT scan of the abdomen and pelvis. Specifically, no
findings to explain the patient's history of left lower quadrant
pain.
2. Uterine fibroids, better demonstrated on previous imaging.

## 2019-11-02 ENCOUNTER — Emergency Department (HOSPITAL_BASED_OUTPATIENT_CLINIC_OR_DEPARTMENT_OTHER): Payer: Self-pay

## 2019-11-02 ENCOUNTER — Other Ambulatory Visit: Payer: Self-pay

## 2019-11-02 ENCOUNTER — Emergency Department (HOSPITAL_BASED_OUTPATIENT_CLINIC_OR_DEPARTMENT_OTHER)
Admission: EM | Admit: 2019-11-02 | Discharge: 2019-11-02 | Disposition: A | Discharge status: Home / Self Care | Payer: Self-pay | Attending: Emergency Medicine | Admitting: Emergency Medicine

## 2019-11-02 ENCOUNTER — Encounter (HOSPITAL_BASED_OUTPATIENT_CLINIC_OR_DEPARTMENT_OTHER): Payer: Self-pay

## 2019-11-02 DIAGNOSIS — J45909 Unspecified asthma, uncomplicated: Secondary | ICD-10-CM | POA: Insufficient documentation

## 2019-11-02 DIAGNOSIS — R609 Edema, unspecified: Secondary | ICD-10-CM | POA: Insufficient documentation

## 2019-11-02 DIAGNOSIS — M79644 Pain in right finger(s): Secondary | ICD-10-CM | POA: Insufficient documentation

## 2019-11-02 DIAGNOSIS — R0602 Shortness of breath: Secondary | ICD-10-CM | POA: Insufficient documentation

## 2019-11-02 DIAGNOSIS — Z79899 Other long term (current) drug therapy: Secondary | ICD-10-CM | POA: Insufficient documentation

## 2019-11-02 LAB — COMPREHENSIVE METABOLIC PANEL
ALT: 11 U/L (ref 0–44)
AST: 22 U/L (ref 15–41)
Albumin: 4.3 g/dL (ref 3.5–5.0)
Alkaline Phosphatase: 54 U/L (ref 38–126)
Anion gap: 9 (ref 5–15)
BUN: 8 mg/dL (ref 6–20)
CO2: 27 mmol/L (ref 22–32)
Calcium: 9 mg/dL (ref 8.9–10.3)
Chloride: 104 mmol/L (ref 98–111)
Creatinine, Ser: 0.64 mg/dL (ref 0.44–1.00)
GFR calc Af Amer: >60 mL/min (ref >60)
GFR calc non Af Amer: >60 mL/min (ref >60)
Glucose, Bld: 84 mg/dL (ref 70–99)
Potassium: 3.5 mmol/L (ref 3.5–5.1)
Sodium: 140 mmol/L (ref 135–145)
Total Bilirubin: 0.5 mg/dL (ref 0.3–1.2)
Total Protein: 7.9 g/dL (ref 6.5–8.1)

## 2019-11-02 LAB — CBC
HCT: 29.4 % — ABNORMAL LOW (ref 36.0–46.0)
Hemoglobin: 8.4 g/dL — ABNORMAL LOW (ref 12.0–15.0)
MCH: 18.6 pg — ABNORMAL LOW (ref 26.0–34.0)
MCHC: 28.6 g/dL — ABNORMAL LOW (ref 30.0–36.0)
MCV: 65.2 fL — ABNORMAL LOW (ref 80.0–100.0)
Platelets: 354 10*3/uL (ref 150–400)
RBC: 4.51 MIL/uL (ref 3.87–5.11)
RDW: 18.1 % — ABNORMAL HIGH (ref 11.5–15.5)
WBC: 4 10*3/uL (ref 4.0–10.5)
nRBC: 0 % (ref 0.0–0.2)

## 2019-11-02 LAB — BRAIN NATRIURETIC PEPTIDE: B Natriuretic Peptide: 19.9 pg/mL (ref 0.0–100.0)

## 2019-11-02 NOTE — ED Notes (Signed)
Placed on cont cardiac monitoring with cont POX and int NBP assessments, 12 lead ECG obtained as well, IV established, labs obtained and to the lab

## 2019-11-02 NOTE — ED Triage Notes (Signed)
Pt c/o bilat LE swelling x 1-2 months-NAD-steady gait

## 2019-11-02 NOTE — ED Notes (Signed)
Presents with swelling of both ankles, states has been intermittent for the past several months. States legs feel tight often, both ankles appear swollen at this time, 1+ with slight indentation. Has some dyspnea when swelling is very noted. Denies any PND or orthopnea, states elevation aids in decreasing swelling.

## 2019-11-02 NOTE — Discharge Instructions (Signed)
You were seen in the ER for chronic intermittent ankle and leg swelling. Labs are normal. Most likely cause is venous stasis or retained fluid in legs from gravity.  No evidence of cardiopulmonary crisis.  Please follow up with either primary care doctor or cardiologist for further discussion of your ongoing symptoms  Right thumb x-ray was normal, as we discussed, symptoms likely from overuse soft tissue or tendon injury.  Discuss with primary care doctor. Thumb spica for comfort, use as needed. Ibuprofen or acetaminophen for pain. Ice.

## 2019-11-02 NOTE — ED Provider Notes (Signed)
[facility] DEPART[facility] Provider Note   CSN: [id number] Arrival date & time: 11/02/19  1338     History Chief Complaint  Patient presents with  . Leg Swelling    Marilyn Rodgers is a 39 y.o. female presents to the ED for evaluation of bilateral feet, ankle and leg swelling.  Chart shows patient has had several ED visits for this for the last 5 years.  Admits this has been going on for a while.  Usually the swelling comes and goes, is bilateral and extending up to the knees.  States this morning her legs were both swollen but since arrival to the ED it has resolved.  Usually leg swelling is worse after prolonged standing and better with elevation.  Reports having associated, intermittent chest discomfort with shortness of breath when her legs are swollen but none currently.  Seen several times for this but states she does not know why it happens or what to do at home.  At some point was told to see a cardiologist but she has not.  She takes turmeric, elderberry and ginger which states helped her swelling.  Denies any leg redness, warmth or calf pain.  No history of blood clots.  States her mother had heart disease.  No personal history of heart failure, hypertension.  Also, patient reports pain in the right thumb for the last month.  Feels like it is out of socket.  Cannot move the thumb as well and feels like it pops in and out of the socket.  She wants x-ray to make sure it is in the right position.  Denies trauma.  She is right-handed.  No associated joint swelling, redness.  HPI     Past Medical History:  Diagnosis Date  . Anemia   . Asthma   . GERD (gastroesophageal reflux disease)   . Shortness of breath   . Umbilical hernia     Patient Active Problem List   Diagnosis Date Noted  . Burn 01/01/2012  . CHEST PAIN 12/14/2009  . DENTAL PAIN 11/06/2009  . HEADACHE 11/06/2009  . ABDOMINAL PAIN 02/19/2009  . 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 BREAST TENDERNESS 10/06/2008  .  TIREDNESS 10/06/2008  . COSTOCHONDRITIS 09/07/2007  . ANEMIA 05/05/2007  . HERNIA 05/05/2007  . DIARRHEA, RECURRENT 05/05/2007  . GERD 12/17/2006  . HIDRADENITIS SUPPURATIVA 08/03/2003    Past Surgical History:  Procedure Laterality Date  . NO PAST SURGERIES       OB History    Gravida  0   Para      Term      Preterm      AB      Living        SAB      TAB      Ectopic      Multiple      Live Births              Family History  Problem Relation Age of Onset  . Diabetes Maternal Aunt   . Heart disease Maternal Aunt   . Hypertension Maternal Aunt   . Cancer Paternal Aunt   . Arthritis Paternal Grandmother   . Diabetes Paternal Grandmother     Social History   Tobacco Use  . Smoking status: Never Smoker  . Smokeless tobacco: Never Used  Vaping Use  . Vaping Use: Never used  Substance Use Topics  . Alcohol use: Yes    Comment: occ  . Drug use: No  Home Medications Prior to Admission medications   Medication Sig Start Date End Date Taking? Authorizing Provider  albuterol (PROVENTIL HFA;VENTOLIN HFA) 108 (90 BASE) MCG/ACT inhaler Inhale 2 puffs into the lungs every 4 (four) hours as needed for wheezing. 08/18/11 08/17/12  Linton Flemings, MD  albuterol (PROVENTIL HFA;VENTOLIN HFA) 108 (90 BASE) MCG/ACT inhaler Inhale 2 puffs into the lungs every 4 (four) hours as needed for wheezing. 10/10/11 10/09/12  Ignacia Felling, PA-C  bacitracin ointment Apply topically 2 (two) times daily. Apply to face twice daily. 01/01/12   Pamella Pert, MD  budesonide-formoterol [facility]) 160-4.5 MCG/ACT inhaler Inhale 2 puffs into the lungs daily. 12/28/13   Waldemar Dickens, MD  diclofenac (VOLTAREN) 75 MG EC tablet Take 1 tablet (75 mg total) by mouth 2 (two) times daily. 12/28/13   Waldemar Dickens, MD  dicyclomine (BENTYL) 20 MG tablet Take 1 tablet (20 mg total) by mouth 2 (two) times daily as needed for spasms. 06/25/16   Julianne Rice, MD  docusate sodium  (COLACE) 100 MG capsule Take 1 capsule (100 mg total) by mouth every 12 (twelve) hours. 06/25/16   Julianne Rice, MD  ferrous sulfate 325 (65 FE) MG tablet Take 1 tablet (325 mg total) by mouth daily. 06/02/18   Petrucelli, Samantha R, PA-C  fluticasone (FLONASE) 50 MCG/ACT nasal spray Place 1-2 sprays into both nostrils daily. 12/28/13   Waldemar Dickens, MD  furosemide (LASIX) 20 MG tablet Take 1 tablet (20 mg total) by mouth daily. 06/22/18   Hayden Rasmussen, MD  HYDROcodone-acetaminophen (NORCO/VICODIN) 5-325 MG tablet Take 1 tablet by mouth every 6 (six) hours as needed for moderate pain or severe pain. 04/04/17   Forde Dandy, MD  methylPREDNISolone (MEDROL DOSEPAK) 4 MG TBPK tablet Take as directed on box 12/02/16   Duffy Bruce, MD  naproxen (NAPROSYN) 500 MG tablet Take 1 tablet (500 mg total) by mouth 2 (two) times daily. 04/04/17   Forde Dandy, MD  oxyCODONE-acetaminophen (PERCOCET) 5-325 MG per tablet Take 1 tablet by mouth every 6 (six) hours as needed for pain. 01/01/12   Pamella Pert, MD  silver sulfADIAZINE (SILVADENE) 1 % cream Apply topically daily. Apply to arm, do not apply to face. 01/01/12   Pamella Pert, MD  traMADol (ULTRAM) 50 MG tablet Take 1 tablet (50 mg total) by mouth every 6 (six) hours as needed. 08/04/15   Leonard Schwartz, MD    Allergies    Other  Review of Systems   Review of Systems  Cardiovascular: Positive for leg swelling (resolved).  Musculoskeletal: Positive for arthralgias.  All other systems reviewed and are negative.   Physical Exam Updated Vital Signs BP 103/84 (BP Location: Right Arm)   Pulse 86   Temp 99.1 F (37.3 C) (Oral)   Resp 14   Ht 5\' 6"  (1.676 m)   Wt 57.6 kg   LMP 10/19/2019 (Approximate)   SpO2 100%   BMI 20.50 kg/m   Physical Exam Vitals and nursing note reviewed.  Constitutional:      General: She is not in acute distress.    Appearance: She is well-developed.     Comments: NAD.  HENT:     Head:  Normocephalic and atraumatic.     Right Ear: External ear normal.     Left Ear: External ear normal.     Nose: Nose normal.  Eyes:     General: No scleral icterus.    Conjunctiva/sclera: Conjunctivae normal.  Cardiovascular:  Rate and Rhythm: Normal rate and regular rhythm.     Heart sounds: Normal heart sounds. No murmur heard.      Comments:  1+ DP and PT pulses bilaterally.  No lower extremity edema, calf pain. Pulmonary:     Effort: Pulmonary effort is normal.     Breath sounds: Normal breath sounds. No wheezing.  Musculoskeletal:        General: Tenderness present. No deformity. Normal range of motion.     Cervical back: Normal range of motion and neck supple.     Comments: Focal tenderness on palmar aspect of the proximal first metacarpal.  Pushes my hand away when I try to further examine the thumb and hand.  I cannot evaluate range of motion.  However, she is able to slightly move her thumb around and there is some popping noted seems to be coming from the MCP joint.  No focal joint edema, erythema, fluctuance or warmth.  No cellulitis of the arm or wrist or hand  Skin:    General: Skin is warm and dry.     Capillary Refill: Capillary refill takes less than 2 seconds.  Neurological:     Mental Status: She is alert and oriented to person, place, and time.  Psychiatric:        Behavior: Behavior normal.        Thought Content: Thought content normal.        Judgment: Judgment normal.     ED Results / Procedures / Treatments   Labs (all labs ordered are listed, but only abnormal results are displayed) Labs Reviewed  CBC - Abnormal; Notable for the following components:      Result Value   Hemoglobin 8.4 (*)    HCT 29.4 (*)    MCV 65.2 (*)    MCH 18.6 (*)    MCHC 28.6 (*)    RDW 18.1 (*)    All other components within normal limits  COMPREHENSIVE METABOLIC PANEL  BRAIN NATRIURETIC PEPTIDE    EKG EKG Interpretation  Date/Time:  Wednesday November 02 2019 14:55:29  EDT Ventricular Rate:  84 PR Interval:    QRS Duration: 88 QT Interval:  327 QTC Calculation: 387 R Axis:   68 Text Interpretation: Sinus rhythm SImilar to prior. No STEMI Confirmed by Nanda Quinton [id number]) on 11/02/2019 2:59:45 PM   Radiology DG Chest Port 1 View  Result Date: 11/02/2019 CLINICAL DATA:  Bilateral ankle swelling EXAM: PORTABLE CHEST 1 VIEW COMPARISON:  06/22/2018 FINDINGS: The heart size and mediastinal contours are within normal limits. Both lungs are clear. The visualized skeletal structures are unremarkable. IMPRESSION: No active disease. Electronically Signed   By: Fidela Salisbury MD   On: 11/02/2019 15:22   DG Finger Thumb Right  Result Date: 11/02/2019 CLINICAL DATA:  Popping sensation EXAM: RIGHT THUMB 2+V COMPARISON:  None. FINDINGS: There is no evidence of fracture or dislocation. There is no evidence of arthropathy or other focal bone abnormality. Soft tissues are unremarkable. IMPRESSION: Negative. Electronically Signed   By: Donavan Foil M.D.   On: 11/02/2019 16:11    Procedures Procedures (including critical care time)  Medications Ordered in ED Medications - No data to display  ED Course  I have reviewed the triage vital signs and the nursing notes.  Pertinent labs & imaging results that were available during my care of the patient were reviewed by me and considered in my medical decision making (see chart for details).  Clinical Course as of Jul 21  [place] Nov 02, 2019  1629 Baseline   Hemoglobin(!): 8.4 [CG]    Clinical Course User Index [CG] Kinnie Feil, PA-C   MDM Rules/Calculators/A&P                          Patient presents with complaint of intermittent, chronic peripheral edema.  Actually states it has resolved since arrival to the ED.  Seems to be more concerned about her right thumb pain.  Patient's available medical records reviewed, has been presenting to several ED's for the last 5 years for bilateral swelling of the feet  and ankles.  Her work-up in the past has been benign.  Lab work, EKG, chest x-ray ordered by triage RN.  I personally visualized and interpreted ER work-up here.  Labs overall show stable anemia, patient aware of this and taking iron.  Chest x-ray nonacute.  BNP normal.  EKG without acute ischemia.  Overall clinical presentation is not consistent with acute CHF, DVT.  She is not in respiratory distress.  No signs of infectious process.  Discussed with patient symptoms likely from dependent edema, venous stasis.  Recommended PCP follow-up.  Return precautions discussed patient is comfortable to plan.  Neuro to the thumb pain, limited exam due to patient cooperation.  X-ray obtained per patient request is nonacute.  Suspect soft tissue, overuse injury.  No signs of septic arthritis, abscess, cellulitis.  Thumb spica for comfort.  Recommended NSAIDs, activity modification, PCP follow-up. Final Clinical Impression(s) / ED Diagnoses Final diagnoses:  Thumb pain, right  Peripheral edema    Rx / DC Orders ED Discharge Orders    None       Arlean Hopping 11/02/19 Elenora Gamma, MD 11/03/19 [phone]

## 2019-11-02 NOTE — ED Notes (Signed)
Pt on monitor
